# Patient Record
Sex: Female | Born: 1950
Health system: Southern US, Community
[De-identification: ages and names within clinical notes are randomized; demographics above are authoritative.]

## PROBLEM LIST (undated history)

## (undated) DIAGNOSIS — I10 Essential (primary) hypertension: Secondary | ICD-10-CM

## (undated) DIAGNOSIS — F419 Anxiety disorder, unspecified: Secondary | ICD-10-CM

## (undated) DIAGNOSIS — C189 Malignant neoplasm of colon, unspecified: Secondary | ICD-10-CM

## (undated) DIAGNOSIS — T753XXA Motion sickness, initial encounter: Secondary | ICD-10-CM

## (undated) DIAGNOSIS — D649 Anemia, unspecified: Secondary | ICD-10-CM

## (undated) HISTORY — PX: CHOLECYSTECTOMY: SHX55

## (undated) HISTORY — DX: Anemia, unspecified: D64.9

---

## 2012-11-05 DIAGNOSIS — C189 Malignant neoplasm of colon, unspecified: Secondary | ICD-10-CM

## 2012-11-05 HISTORY — DX: Malignant neoplasm of colon, unspecified: C18.9

## 2014-11-05 HISTORY — PX: COLON SURGERY: SHX602

## 2017-09-07 ENCOUNTER — Encounter: Payer: Self-pay | Admitting: Emergency Medicine

## 2017-09-07 ENCOUNTER — Ambulatory Visit
Admission: EM | Admit: 2017-09-07 | Discharge: 2017-09-07 | Disposition: A | Discharge status: Home / Self Care | Payer: Self-pay | Attending: Emergency Medicine | Admitting: Emergency Medicine

## 2017-09-07 DIAGNOSIS — N390 Urinary tract infection, site not specified: Secondary | ICD-10-CM

## 2017-09-07 HISTORY — DX: Malignant neoplasm of colon, unspecified: C18.9

## 2017-09-07 HISTORY — DX: Essential (primary) hypertension: I10

## 2017-09-07 LAB — URINALYSIS, COMPLETE (UACMP) WITH MICROSCOPIC
BILIRUBIN URINE: NEGATIVE
GLUCOSE, UA: NEGATIVE mg/dL
Ketones, ur: NEGATIVE mg/dL
NITRITE: NEGATIVE
Protein, ur: NEGATIVE mg/dL
pH: 6 (ref 5.0–8.0)

## 2017-09-07 MED ORDER — PHENAZOPYRIDINE HCL 200 MG PO TABS: 200.0000 mg | ORAL_TABLET | Freq: Three times a day (TID) | ORAL | 0 refills | Status: DC

## 2017-09-07 MED ORDER — NITROFURANTOIN MONOHYD MACRO 100 MG PO CAPS: 100.0000 mg | ORAL_CAPSULE | Freq: Two times a day (BID) | ORAL | 0 refills | Status: DC

## 2017-09-07 NOTE — ED Triage Notes (Signed)
Patient c/o burning when urinating for the past 5 days.

## 2017-09-07 NOTE — ED Provider Notes (Signed)
[facility]    CSN: [id number] Arrival date & time: 09/07/17  1459     History   Chief Complaint Chief Complaint  Patient presents with  . Dysuria    HPI Joyce Pace is a 66 y.o. female.   HPI  66 year old female visiting from [facility] presents with burning urgency frequency for the last 5 days. He denies any fever or chills has had no back pain. Said no vaginal discharge or discomfort. Had a UTI about 1 year ago. Is not get them often.         Past Medical History:  Diagnosis Date  . Colon cancer ([place])   . Hypertension     There are no active problems to display for this patient.   Past Surgical History:  Procedure Laterality Date  . ABDOMINAL SURGERY      OB History    No data available       Home Medications    Prior to Admission medications   Medication Sig Start Date End Date Taking? Authorizing Provider  lisinopril (PRINIVIL,ZESTRIL) 10 MG tablet Take 10 mg by mouth daily.   Yes [provider]  simvastatin (ZOCOR) 20 MG tablet Take 20 mg by mouth daily.   Yes [provider]  nitrofurantoin, macrocrystal-monohydrate, (MACROBID) 100 MG capsule Take 1 capsule (100 mg total) by mouth 2 (two) times daily. 09/07/17   Lorin Picket, PA-C  phenazopyridine (PYRIDIUM) 200 MG tablet Take 1 tablet (200 mg total) by mouth 3 (three) times daily. 09/07/17   Lorin Picket, PA-C    Family History Family History  Problem Relation Age of Onset  . Hyperlipidemia Mother   . Diabetes Father     Social History Social History  Substance Use Topics  . Smoking status: Never Smoker  . Smokeless tobacco: Never Used  . Alcohol use No     Allergies   Patient has no known allergies.   Review of Systems Review of Systems  Constitutional: Positive for activity change. Negative for chills, fatigue and fever.  Genitourinary: Positive for dysuria, frequency and urgency. Negative for vaginal discharge and  vaginal pain.  All other systems reviewed and are negative.    Physical Exam Triage Vital Signs ED Triage Vitals  Enc Vitals Group     BP --      Pulse --      Resp --      Temp --      Temp src --      SpO2 --      Weight 09/07/17 1537 150 lb (68 kg)     Height 09/07/17 1537 5' (1.524 m)     Head Circumference --      Peak Flow --      Pain Score 09/07/17 1538 3     Pain Loc --      Pain Edu? --      Excl. in [place]? --    No data found.   Updated Vital Signs BP 130/78 (BP Location: Left Arm)   Pulse 77   Temp 98.2 F (36.8 C) (Oral)   Resp 16   Ht 5' (1.524 m)   Wt 150 lb (68 kg)   SpO2 97%   BMI 29.29 kg/m   Visual Acuity Right Eye Distance:   Left Eye Distance:   Bilateral Distance:    Right Eye Near:   Left Eye Near:    Bilateral Near:     Physical Exam  Constitutional:  She is oriented to person, place, and time. She appears well-developed and well-nourished. No distress.  HENT:  Head: Normocephalic.  Eyes: Pupils are equal, round, and reactive to light.  Neck: Normal range of motion. Neck supple.  Pulmonary/Chest: Effort normal and breath sounds normal.  Abdominal: Soft. Bowel sounds are normal.  Musculoskeletal: Normal range of motion.  Neurological: She is alert and oriented to person, place, and time.  Skin: Skin is warm and dry. She is not diaphoretic.  Psychiatric: She has a normal mood and affect. Her behavior is normal. Judgment and thought content normal.  Nursing note and vitals reviewed.    UC Treatments / Results  Labs (all labs ordered are listed, but only abnormal results are displayed) Labs Reviewed  URINALYSIS, COMPLETE (UACMP) WITH MICROSCOPIC - Abnormal; Notable for the following:       Result Value   APPearance HAZY (*)    Specific Gravity, Urine <1.005 (*)    Hgb urine dipstick SMALL (*)    Leukocytes, UA TRACE (*)    Squamous Epithelial / LPF 0-5 (*)    Bacteria, UA FEW (*)    All other components within normal limits    URINE CULTURE    EKG  EKG Interpretation None       Radiology No results found.  Procedures Procedures (including critical care time)  Medications Ordered in UC Medications - No data to display   Initial Impression / Assessment and Plan / UC Course  I have reviewed the triage vital signs and the nursing notes.  Pertinent labs & imaging results that were available during my care of the patient were reviewed by me and considered in my medical decision making (see chart for details).     Plan: 1. Test/x-ray results and diagnosis reviewed with patient 2. rx as per orders; risks, benefits, potential side effects reviewed with patient 3. Recommend supportive treatment with rest fluids. We will culture her urine which will be available in 48 hours. I prescribed Pyridium and advised her that this will turn her urine orange. If she is not improving she may return to our clinic. 4. F/u prn if symptoms worsen or don't improve   Final Clinical Impressions(s) / UC Diagnoses   Final diagnoses:  Lower urinary tract infectious disease    New Prescriptions Discharge Medication List as of 09/07/2017  4:45 PM    START taking these medications   Details  nitrofurantoin, macrocrystal-monohydrate, (MACROBID) 100 MG capsule Take 1 capsule (100 mg total) by mouth 2 (two) times daily., Starting Sat 09/07/2017, Normal    phenazopyridine (PYRIDIUM) 200 MG tablet Take 1 tablet (200 mg total) by mouth 3 (three) times daily., Starting Sat 09/07/2017, Normal         Controlled Substance Prescriptions [place] Controlled Substance Registry consulted? Not Applicable   Lorin Picket, PA-C 09/07/17 1656

## 2017-09-09 LAB — URINE CULTURE: Culture: <10000 — AB

## 2017-09-13 ENCOUNTER — Ambulatory Visit: Payer: Self-pay | Admitting: Family Medicine

## 2017-12-13 ENCOUNTER — Telehealth: Payer: Self-pay | Admitting: Emergency Medicine

## 2017-12-13 NOTE — Telephone Encounter (Signed)
Patient called after receiving a letter in the mail.  Patient was informed of her urine culture result.  Patient states that her symptoms have resolve and feeling better.

## 2018-02-19 ENCOUNTER — Encounter: Payer: Self-pay | Admitting: Nurse Practitioner

## 2018-02-19 ENCOUNTER — Telehealth: Payer: Self-pay

## 2018-02-19 ENCOUNTER — Other Ambulatory Visit: Payer: Self-pay

## 2018-02-19 ENCOUNTER — Ambulatory Visit (INDEPENDENT_AMBULATORY_CARE_PROVIDER_SITE_OTHER): Payer: Medicare Other | Admitting: Nurse Practitioner

## 2018-02-19 VITALS — BP 150/60 | HR 70 | Temp 97.8°F | Ht 60.0 in | Wt 151.6 lb

## 2018-02-19 DIAGNOSIS — E785 Hyperlipidemia, unspecified: Secondary | ICD-10-CM

## 2018-02-19 DIAGNOSIS — K219 Gastro-esophageal reflux disease without esophagitis: Secondary | ICD-10-CM

## 2018-02-19 DIAGNOSIS — Z7689 Persons encountering health services in other specified circumstances: Secondary | ICD-10-CM | POA: Diagnosis not present

## 2018-02-19 DIAGNOSIS — F329 Major depressive disorder, single episode, unspecified: Secondary | ICD-10-CM

## 2018-02-19 DIAGNOSIS — I10 Essential (primary) hypertension: Secondary | ICD-10-CM | POA: Diagnosis not present

## 2018-02-19 DIAGNOSIS — F32A Depression, unspecified: Secondary | ICD-10-CM | POA: Insufficient documentation

## 2018-02-19 DIAGNOSIS — R142 Eructation: Secondary | ICD-10-CM

## 2018-02-19 DIAGNOSIS — K5909 Other constipation: Secondary | ICD-10-CM | POA: Diagnosis not present

## 2018-02-19 DIAGNOSIS — R1013 Epigastric pain: Secondary | ICD-10-CM | POA: Diagnosis not present

## 2018-02-19 DIAGNOSIS — F419 Anxiety disorder, unspecified: Secondary | ICD-10-CM | POA: Insufficient documentation

## 2018-02-19 DIAGNOSIS — E782 Mixed hyperlipidemia: Secondary | ICD-10-CM | POA: Insufficient documentation

## 2018-02-19 DIAGNOSIS — Z85038 Personal history of other malignant neoplasm of large intestine: Secondary | ICD-10-CM | POA: Insufficient documentation

## 2018-02-19 MED ORDER — SIMVASTATIN 20 MG PO TABS: 20.0000 mg | ORAL_TABLET | Freq: Every day | ORAL | 1 refills | Status: DC

## 2018-02-19 MED ORDER — LISINOPRIL 20 MG PO TABS: 20.0000 mg | ORAL_TABLET | Freq: Every day | ORAL | 2 refills | Status: DC

## 2018-02-19 MED ORDER — DOCUSATE SODIUM 100 MG PO CAPS: 100.0000 mg | ORAL_CAPSULE | Freq: Every day | ORAL | 1 refills | Status: DC | PRN

## 2018-02-19 MED ORDER — POLYETHYLENE GLYCOL 3350 17 GM/SCOOP PO POWD: 17.0000 g | Freq: Every day | ORAL | 1 refills | Status: DC

## 2018-02-19 MED ORDER — PAROXETINE HCL 10 MG PO TABS: 10.0000 mg | ORAL_TABLET | ORAL | 2 refills | Status: DC

## 2018-02-19 NOTE — Telephone Encounter (Signed)
The pt was calling requesting that we send something over for her acid reflux. She said she was taking Protonix 40 MG at one time, but symptoms improved. She is willing to go on Protonix 20 MG if that's okay with you, but would do whatever you recommend. Please advise

## 2018-02-19 NOTE — Progress Notes (Signed)
Subjective:    Patient ID: Joyce Pace, female    DOB: 05-07-1951, 67 y.o.   MRN: [id number]  Joyce Pace is a 67 y.o. female presenting on 02/19/2018 for Establish Care (history colon cancer, need order colonoscopy, bloodwork.); Constipation (pt states she can go with bm for 2-3 days and then she have to take a stimulant laxative); and Abdominal Pain   HPI Establish Care New Provider Pt last seen by PCP approximately 8 months ago.  Obtain records.   Abdominal Pain Pt reports intermittent, but daily abdominal pain that pt presumes may be Gas pain.  She reports her "eating habits are bad."  She admits she eats small snacks and meals on the go and doesn't sit to eat for meals. - Is working to incorporate fruits and vegetables into smoothies. - Abdominal pain is only ocasional.  Has to stretch out, if leaning forward or standing, is worse. Is epigastric pain.  Han press upper abdomen and has belching then relief.  Significant belching is noted and is bothersome to pt. - Took a medication for acid reflux "Purple pill" about 10-15 years ago.  - Pt has history of colon cancer stage "3 and a half" spread to ovary, but ovary was removed.  Chemo x 6 months  Followup with CT scan and blood work.  Was due in October 2018 and missed that appointment 2/2 relocation to [place] from [place].  Colonoscopy also due per pt report for surveillance.  - Constipation: takes milk of magnesia, also "fiber pills" (colon cleanse supplement) to achieve BM Has BM regularly when taking the colon cleanse supplement or milk of magnesia. - Pt has taken metamucil and miralax x 2 days in past and notes this is not effective. - No medications have resolved epigastric pain or belching.  Past Medical History:  Diagnosis Date  . Colon cancer ([place])   . Hypertension    Past Surgical History:  Procedure Laterality Date  . CHOLECYSTECTOMY    . COLON SURGERY  2016   colon resection   Social History   Socioeconomic  History  . Marital status: Married    Spouse name: Not on file  . Number of children: Not on file  . Years of education: Not on file  . Highest education level: High school graduate  Occupational History  . Not on file  Social Needs  . Financial resource strain: Not very hard  . Food insecurity:    Worry: Never true    Inability: Never true  . Transportation needs:    Medical: No    Non-medical: No  Tobacco Use  . Smoking status: Never Smoker  . Smokeless tobacco: Never Used  Substance and Sexual Activity  . Alcohol use: No  . Drug use: No  . Sexual activity: Not Currently  Lifestyle  . Physical activity:    Days per week: 2 days    Minutes per session: 30 min  . Stress: To some extent  Relationships  . Social connections:    Talks on phone: Not on file    Gets together: Not on file    Attends religious service: More than 4 times per year    Active member of club or organization: Yes    Attends meetings of clubs or organizations: More than 4 times per year    Relationship status: Married  . Intimate partner violence:    Fear of current or ex partner: No    Emotionally abused: No    Physically abused: No  Forced sexual activity: No  Other Topics Concern  . Not on file  Social History Narrative  . Not on file   Family History  Problem Relation Age of Onset  . Hyperlipidemia Mother   . Hypertension Mother   . Diabetes Father   . Hypertension Sister   . Hyperlipidemia Sister   . Hyperlipidemia Brother   . Hypertension Brother   . Ovarian cancer Daughter   . Healthy Son   . Heart disease Maternal Grandmother   . Heart disease Maternal Grandfather   . Heart disease Paternal Grandmother   . Heart disease Paternal Grandfather   . Hyperlipidemia Sister   . Healthy Daughter    No current outpatient medications on file prior to visit.   No current facility-administered medications on file prior to visit.     Review of Systems  Constitutional: Negative.   Negative for activity change, appetite change and unexpected weight change.  HENT: Negative.  Negative for congestion.   Eyes: Negative.   Respiratory: Negative.  Negative for cough and shortness of breath.   Cardiovascular: Negative.  Negative for chest pain and leg swelling.  Gastrointestinal: Positive for abdominal pain and constipation.       Belching  Endocrine: Negative.  Negative for cold intolerance and heat intolerance.  Genitourinary: Negative.   Musculoskeletal: Negative.  Negative for arthralgias and myalgias.  Skin: Negative.  Negative for rash.  Allergic/Immunologic: Negative.   Neurological: Negative.   Hematological: Negative.   Psychiatric/Behavioral: Positive for dysphoric mood (tearfulness - daughter is diagnosed with ovarian cancer and is undergoing chemo). The patient is nervous/anxious.    Per HPI unless specifically indicated above     Objective:    BP (!) 150/60 (BP Location: Left Arm, Patient Position: Sitting, Cuff Size: Normal)   Pulse 70   Temp 97.8 F (36.6 C) (Oral)   Ht 5' (1.524 m)   Wt 151 lb 9.6 oz (68.8 kg)   BMI 29.61 kg/m   Wt Readings from Last 3 Encounters:  02/19/18 151 lb 9.6 oz (68.8 kg)  09/07/17 150 lb (68 kg)    Physical Exam  Constitutional: She is oriented to person, place, and time. She appears well-developed and well-nourished. No distress.  HENT:  Head: Normocephalic and atraumatic.  Neck: Normal range of motion. Neck supple. Carotid bruit is not present. No thyromegaly present.  Cardiovascular: Normal rate, regular rhythm, S1 normal, S2 normal, normal heart sounds and intact distal pulses.  Pulmonary/Chest: Effort normal and breath sounds normal. No respiratory distress.  Abdominal: Soft. Normal appearance and bowel sounds are normal. She exhibits no shifting dullness and no distension. There is tenderness in the right upper quadrant and epigastric area. There is no rigidity, no rebound, no guarding, no CVA tenderness, no  tenderness at McBurney's point and negative Murphy's sign. No hernia.  Musculoskeletal: She exhibits no edema (pedal).  Neurological: She is alert and oriented to person, place, and time.  Skin: Skin is warm and dry.  Psychiatric: Her behavior is normal. Judgment and thought content normal. Her affect is labile (tearful at times, mildly anxious). Cognition and memory are normal.  Vitals reviewed.  Results for orders placed or performed during the hospital encounter of 09/07/17  Urine culture  Result Value Ref Range   Specimen Description URINE, CLEAN CATCH    Special Requests NONE    Culture (A)     <10,000 COLONIES/mL INSIGNIFICANT GROWTH Performed at [facility] [address], [place], [place] [address]  Report Status 09/09/2017 FINAL   Urinalysis, Complete w Microscopic  Result Value Ref Range   Color, Urine YELLOW YELLOW   APPearance HAZY (A) CLEAR   Specific Gravity, Urine <1.005 (L) 1.005 - 1.030   pH 6.0 5.0 - 8.0   Glucose, UA NEGATIVE NEGATIVE mg/dL   Hgb urine dipstick SMALL (A) NEGATIVE   Bilirubin Urine NEGATIVE NEGATIVE   Ketones, ur NEGATIVE NEGATIVE mg/dL   Protein, ur NEGATIVE NEGATIVE mg/dL   Nitrite NEGATIVE NEGATIVE   Leukocytes, UA TRACE (A) NEGATIVE   Squamous Epithelial / LPF 0-5 (A) NONE SEEN   WBC, UA 21-50 0 - 5 WBC/hpf   RBC / HPF 6-30 0 - 5 RBC/hpf   Bacteria, UA FEW (A) NONE SEEN      Assessment & Plan:   Problem List Items Addressed This Visit      Cardiovascular and Mediastinum   Essential hypertension   Relevant Medications   simvastatin (ZOCOR) 20 MG tablet   lisinopril (PRINIVIL,ZESTRIL) 20 MG tablet     Digestive   History of colon cancer, stage III   Relevant Orders   Ambulatory referral to Gastroenterology     Other   Hyperlipidemia   Relevant Medications   simvastatin (ZOCOR) 20 MG tablet   lisinopril (PRINIVIL,ZESTRIL) 20 MG tablet   Reactive depression   Relevant Medications   PARoxetine (PAXIL) 10 MG tablet  (Start on 02/20/2018)    Other Visit Diagnoses    Epigastric pain    -  Primary   Encounter to establish care       Belching       Other constipation       Relevant Medications   docusate sodium (COLACE) 100 MG capsule   polyethylene glycol powder (GLYCOLAX/MIRALAX) powder      Meds ordered this encounter  Medications  . simvastatin (ZOCOR) 20 MG tablet    Sig: Take 1 tablet (20 mg total) by mouth daily.    Dispense:  30 tablet    Refill:  1    Order Specific Question:   Supervising Provider    Answer:   Olin Hauser [[id number]]  . PARoxetine (PAXIL) 10 MG tablet    Sig: Take 1 tablet (10 mg total) by mouth 2 (two) times a week.    Dispense:  30 tablet    Refill:  2    Order Specific Question:   Supervising Provider    Answer:   Olin Hauser [[id number]]  . lisinopril (PRINIVIL,ZESTRIL) 20 MG tablet    Sig: Take 1 tablet (20 mg total) by mouth daily.    Dispense:  30 tablet    Refill:  2    Order Specific Question:   Supervising Provider    Answer:   Olin Hauser [[id number]]  . docusate sodium (COLACE) 100 MG capsule    Sig: Take 1 capsule (100 mg total) by mouth daily as needed for mild constipation (If no BM in 2 days).    Dispense:  30 capsule    Refill:  1    Order Specific Question:   Supervising Provider    Answer:   Olin Hauser [[id number]]  . polyethylene glycol powder (GLYCOLAX/MIRALAX) powder    Sig: Take 17 g by mouth daily.    Dispense:  3350 g    Refill:  1    Order Specific Question:   Supervising Provider    Answer:   Olin Hauser [[id number]]   #1 Establish Care: Previous PCP was  in [place].  Records will be requested.  Past medical, family, and surgical history reviewed w/ pt. - Last physical exam was in April 2018.  New to medicare. Schedule Welcome to Medicare visit for screenings.   #2 Abdominal pain, constipation, bloating: Pt with subacute to chronic abdominal pain with belching not always associated with eating.  Pain  resolves with belching.  Likely secondary to GERD as pt has had reflux in past treated with Prilosec.  No recent acid reducers have been tried.  Constipation is also regular with inability to have BM in 2-3 days regularly.  Pt is taking milk of magnesia with effect, but is unable to have BM without this medication.  Can also worsen abdominal pain with strong laxative effect.  Plan: 1. Treat GERD with omeprazole 20 mg once daily. 2. START miralax 1/2-1 dose daily. 3. START colace 100 mg tablet once daily prn no BM in 2 days. 4. Avoid use of milk of mag and colon cleanse supplement. 5. Followup as needed and with GI  #3 History of colon cancer: Pt without recent followup for schedule of surveillance.  Due in October 2018, so is 6 months overdue.  - Referral to GI for colonoscopy.  Will defer bloodwork to GI for further evaluation.  #4 Depression (reactive) in response to daughter's cancer diagnosis and treatment.  Pt has taken paxil in past 20 mg daily, but is currently only taking 10 mg 2x per week which is not an effective dose.  Plan: 1. Take Paxil 10 mg tablet once daily.  Consider dose increase if not effective at next appointment.   Follow up plan: Return in about 1 month (around 03/19/2018) for annual physical.  Cassell Smiles, DNP, AGPCNP-BC Adult Gerontology Primary Care Nurse Practitioner [facility] 02/19/2018, 12:47 PM

## 2018-02-19 NOTE — Patient Instructions (Addendum)
Joyce Pace,   Thank you for coming in to clinic today.  1. For your abdominal pain: This is likely acid reflux.   START taking omeprazole 20 mg once daily.  Take daily for at least 2 weeks.  You may continue and we can reassess in 3 months.  2. For your constipation: -  START daily miralax 1/2 - 1 dose daily. - START Colace 100 mg capsule once daily as needed if no BM in 2 days.  3. Continue simvastatin.  4. INCREASE lisinopril to 20 mg once daily  5. INCREASE paxil to 10 mg daily.  For your referral to GI, their office will call you.   Please schedule a follow-up appointment with Cassell Smiles, AGNP. Return in about 1 month (around 03/19/2018) for annual physical.  If you have any other questions or concerns, please feel free to call the clinic or send a message through [facility]. You may also schedule an earlier appointment if necessary.  You will receive a survey after today's visit either digitally by e-mail or paper by [employer]. Your experiences and feedback matter to [place].  Please respond so we know how we are doing as we provide care for you.   Cassell Smiles, DNP, AGNP-BC Adult Gerontology Nurse Practitioner [place]

## 2018-02-20 ENCOUNTER — Other Ambulatory Visit: Payer: Self-pay | Admitting: Nurse Practitioner

## 2018-02-20 DIAGNOSIS — F329 Major depressive disorder, single episode, unspecified: Secondary | ICD-10-CM

## 2018-02-20 MED ORDER — PAROXETINE HCL 10 MG PO TABS: 10.0000 mg | ORAL_TABLET | Freq: Every day | ORAL | 2 refills | Status: DC

## 2018-02-20 MED ORDER — OMEPRAZOLE 20 MG PO CPDR: 20.0000 mg | DELAYED_RELEASE_CAPSULE | Freq: Every day | ORAL | 3 refills | Status: DC

## 2018-02-20 NOTE — Telephone Encounter (Signed)
We had discussed starting omeprazole 20 mg once daily during her visit.  Order wasn't sent.  Apologies for inconveniences.

## 2018-03-05 ENCOUNTER — Other Ambulatory Visit: Payer: Self-pay | Admitting: Nurse Practitioner

## 2018-03-05 ENCOUNTER — Ambulatory Visit: Payer: Self-pay | Admitting: Gastroenterology

## 2018-03-05 ENCOUNTER — Telehealth: Payer: Self-pay | Admitting: Nurse Practitioner

## 2018-03-05 DIAGNOSIS — I10 Essential (primary) hypertension: Secondary | ICD-10-CM

## 2018-03-05 NOTE — Telephone Encounter (Signed)
The pt was notified that all her prescription was sent over to her pharmacy. She verbalize understanding, no questions or concerns.

## 2018-03-05 NOTE — Telephone Encounter (Signed)
Please call pt about anxiety medication [phone]

## 2018-03-05 NOTE — Progress Notes (Deleted)
She was recently seen by Mikey College, NP and was complaining of constipation, abdominal pain. She has had a history of colon cancer in the past "stage 3", did mention that her colonoscopy was due in 08/2017 .    Constipation: Onset *** Frequency of bowel movements *** Consistency *** Shape *** , any new change Pain *** Bloating/abdominal distension *** Bleeding *** Last colonoscopy *** Weight loss *** Family history of colon cancer *** Diet *** Narcotic/anticholinergic medications *** Laxative use *** Thyroid abnormalities ***  For colonoscopy for surveillance after colon cancer. H/o new onset constipation.   Plan  1. High fiber diet patient information provided 2. Colonoscopy    I have discussed alternative options, risks & benefits,  which include, but are not limited to, bleeding, infection, perforation,respiratory complication & drug reaction.  The patient agrees with this plan & written consent will be obtained.

## 2018-04-07 ENCOUNTER — Other Ambulatory Visit: Payer: Self-pay

## 2018-04-07 ENCOUNTER — Ambulatory Visit (INDEPENDENT_AMBULATORY_CARE_PROVIDER_SITE_OTHER): Payer: Medicare Other | Admitting: Nurse Practitioner

## 2018-04-07 ENCOUNTER — Encounter: Payer: Self-pay | Admitting: Nurse Practitioner

## 2018-04-07 VITALS — BP 131/55 | HR 69 | Ht 60.0 in | Wt 151.4 lb

## 2018-04-07 DIAGNOSIS — Z85038 Personal history of other malignant neoplasm of large intestine: Secondary | ICD-10-CM

## 2018-04-07 DIAGNOSIS — E782 Mixed hyperlipidemia: Secondary | ICD-10-CM

## 2018-04-07 DIAGNOSIS — K219 Gastro-esophageal reflux disease without esophagitis: Secondary | ICD-10-CM

## 2018-04-07 DIAGNOSIS — I10 Essential (primary) hypertension: Secondary | ICD-10-CM

## 2018-04-07 DIAGNOSIS — Z1329 Encounter for screening for other suspected endocrine disorder: Secondary | ICD-10-CM

## 2018-04-07 DIAGNOSIS — F329 Major depressive disorder, single episode, unspecified: Secondary | ICD-10-CM

## 2018-04-07 DIAGNOSIS — E785 Hyperlipidemia, unspecified: Secondary | ICD-10-CM

## 2018-04-07 MED ORDER — SIMVASTATIN 20 MG PO TABS: 20.0000 mg | ORAL_TABLET | Freq: Every day | ORAL | 1 refills | Status: DC

## 2018-04-07 MED ORDER — PAROXETINE HCL 10 MG PO TABS: 10.0000 mg | ORAL_TABLET | Freq: Every day | ORAL | 1 refills | Status: DC

## 2018-04-07 MED ORDER — OMEPRAZOLE 20 MG PO CPDR: 20.0000 mg | DELAYED_RELEASE_CAPSULE | Freq: Every day | ORAL | 1 refills | Status: DC

## 2018-04-07 NOTE — Progress Notes (Signed)
Subjective:    Patient ID: Joyce Pace, female    DOB: [birth year], 67 y.o.   MRN: [id number]  Joyce Pace is a 67 y.o. female presenting on 04/07/2018 for Hypertension; Gastroesophageal Reflux; and Insomnia   HPI Hypertension - She is not checking BP at home or outside of clinic.    - Current medications: lisinopril 20 mg daily, tolerating well without side effects - She is not currently symptomatic. - Pt denies headache, lightheadedness, dizziness, changes in vision, chest tightness/pressure, palpitations, leg swelling, sudden loss of speech or loss of consciousness.  Lightheadeness is improved and has not been experienced in several weeks. - She  reports an exercise routine that includes housework, woodworking, 5-6 days per week. - Her diet is moderate in salt, moderate in fat, and moderate in carbohydrates.   Hyperlipidemia Pt has started and continued taking simvastatin 20 mg once daily.  Is taking without side effects.  Denies myalgias and cognitive deficits.  Patient requests lab recheck today.  GERD Bloating and gas is much improved and completely resolved per pt.  Omeprazole is helping symptoms significantly.  Pt used miralax and colace for short term.  Now is only using metamucil generic as needed for constipation.  No heartburn or stomach pain today.  Denies all globus sensation, GI bleed symptoms, constipation, diarrhea, nausea, vomiting.  History of colon cancer Last CEA = 3.2 February 22, 2017 - history of colon ca with resection.  Status post chemo [place].  Final visit summary available in scanned documents.  Patient is due for GI visit April 16, 2018.  Requests to have tumor marker collected today.  Insomnia/tearfulness/depression Patient notes her insomnia continues, but is back to baseline for her.  She is less tearful, but is sensitive and is tearful in appropriate situations.  Depression is resolving per patient.  Moods are much more stable.  Patient is  taking paroxetine 10 mg once daily and tolerating well without side effects.  Patient also notes life stressors are beginning to improve.  Depression screen PHQ 2/9 02/19/2018  Decreased Interest 0  Down, Depressed, Hopeless 1  PHQ - 2 Score 1    Social History   Tobacco Use  . Smoking status: Never Smoker  . Smokeless tobacco: Never Used  Substance Use Topics  . Alcohol use: No  . Drug use: No    Review of Systems Per HPI unless specifically indicated above     Objective:    BP (!) 131/55 (BP Location: Right Arm, Patient Position: Sitting, Cuff Size: Normal)   Pulse 69   Ht 5' (1.524 m)   Wt 151 lb 6.4 oz (68.7 kg)   BMI 29.57 kg/m   Wt Readings from Last 3 Encounters:  04/07/18 151 lb 6.4 oz (68.7 kg)  02/19/18 151 lb 9.6 oz (68.8 kg)  09/07/17 150 lb (68 kg)    Physical Exam  Constitutional: She is oriented to person, place, and time. She appears well-developed and well-nourished. No distress.  HENT:  Head: Normocephalic and atraumatic.  Neck: No thyromegaly present.  Cardiovascular: Normal rate, regular rhythm, S1 normal, S2 normal, normal heart sounds and intact distal pulses.  Pulmonary/Chest: Effort normal and breath sounds normal. No respiratory distress.  Abdominal: Soft. Bowel sounds are normal. She exhibits no distension. There is no hepatosplenomegaly. There is no tenderness. No hernia.  Neurological: She is alert and oriented to person, place, and time.  Skin: Skin is warm and dry. Capillary refill takes less than 2 seconds.  Psychiatric: She has  a normal mood and affect. Her behavior is normal. Judgment and thought content normal.  Vitals reviewed.  Results for orders placed or performed during the hospital encounter of 09/07/17  Urine culture  Result Value Ref Range   Specimen Description URINE, CLEAN CATCH    Special Requests NONE    Culture (A)     <10,000 COLONIES/mL INSIGNIFICANT GROWTH Performed at [facility] [address],  [place], [place] [address]    Report Status 09/09/2017 FINAL   Urinalysis, Complete w Microscopic  Result Value Ref Range   Color, Urine YELLOW YELLOW   APPearance HAZY (A) CLEAR   Specific Gravity, Urine <1.005 (L) 1.005 - 1.030   pH 6.0 5.0 - 8.0   Glucose, UA NEGATIVE NEGATIVE mg/dL   Hgb urine dipstick SMALL (A) NEGATIVE   Bilirubin Urine NEGATIVE NEGATIVE   Ketones, ur NEGATIVE NEGATIVE mg/dL   Protein, ur NEGATIVE NEGATIVE mg/dL   Nitrite NEGATIVE NEGATIVE   Leukocytes, UA TRACE (A) NEGATIVE   Squamous Epithelial / LPF 0-5 (A) NONE SEEN   WBC, UA 21-50 0 - 5 WBC/hpf   RBC / HPF 6-30 0 - 5 RBC/hpf   Bacteria, UA FEW (A) NONE SEEN      Assessment & Plan:   Problem List Items Addressed This Visit      Cardiovascular and Mediastinum   Essential hypertension Controlled today on exam.  Medications tolerated without side effects.  Continue at current doses.  Check labs today. Followup 6 months.    Relevant Medications   simvastatin (ZOCOR) 40 MG tablet   Other Relevant Orders   COMPLETE METABOLIC PANEL WITH GFR (Completed)     Other   Hyperlipidemia Stable today on exam and last lipid check.  Medications tolerated without side effects.  Continue at current doses.  Refills provided.  Check labs today. Followup 6 months.    Relevant Medications   simvastatin (ZOCOR) 40 MG tablet   Other Relevant Orders   Lipid panel (Completed)   TSH (Completed)   Reactive depression Improved today on exam.  Medications tolerated without side effects.  Continue at current doses.  Refills provided.  Encouraged ongoing self-management of moods/stress reduction. Followup 6 months.    Relevant Medications   PARoxetine (PAXIL) 10 MG tablet    Other Visit Diagnoses    Gastroesophageal reflux disease, esophagitis presence not specified    -  Primary Worsening with regular abdominal pain and heartburn.  Possible overuse of OTC laxatives.  Patient has not seen Dr. Bonna Gains yet, but is scheduled in  next 2 weeks.  START omeprazole 20 mg once daily.  Avoid food triggers.  Use OTC laxatives only as needed for constipation > 3 days without BM.  Labs today.  Followup as needed and in 6 months.   Relevant Medications   omeprazole (PRILOSEC) 20 MG capsule   Other Relevant Orders   CBC with Differential/Platelet (Completed)   Screening for thyroid disorder     Screening for TSH abnormality with Constipation/diarrhea.  Labs today.  Followup as needed.   Relevant Orders   TSH (Completed)   History of colon cancer, stage II     Continue with labs for monitoring tumor marker.  Continue follow-up with Dr. Bonna Gains for repeat screening colonoscopy.  Consider future referral to oncology for followup.   Relevant Orders   CEA      Meds ordered this encounter  Medications  . omeprazole (PRILOSEC) 20 MG capsule    Sig: Take 1 capsule (  20 mg total) by mouth daily.    Dispense:  90 capsule    Refill:  1    Order Specific Question:   Supervising Provider    Answer:   Olin Hauser [[id number]]  .  PARoxetine (PAXIL) 10 MG tablet    Sig: Take 1 tablet (10 mg total) by mouth daily.    Dispense:  90 tablet    Refill:  1    Order Specific Question:   Supervising Provider    Answer:   Olin Hauser [[id number]]  . DISCONTD: simvastatin (ZOCOR) 20 MG tablet    Sig: Take 1 tablet (20 mg total) by mouth daily.    Dispense:  90 tablet    Refill:  1    Order Specific Question:   Supervising Provider    Answer:   Olin Hauser [[id number]]  . simvastatin (ZOCOR) 40 MG tablet    Sig: Take 1 tablet (40 mg total) by mouth at bedtime.    Dispense:  90 tablet    Refill:  3    NOTE INCREASED DOSE    Order Specific Question:   Supervising Provider    Answer:   Olin Hauser [[id number]]    Follow up plan: Return in about 2 months (around 06/07/2018) for Welcome to Medicare wellness and 6 months for  hypertension, GERD, Depression.  Cassell Smiles, DNP, AGPCNP-BC Adult Gerontology  Primary Care Nurse Practitioner [facility] 04/07/2018, 11:05 AM

## 2018-04-07 NOTE — Patient Instructions (Addendum)
Joyce Pace,   Thank you for coming in to clinic today.  1. Continue medications today for blood pressure and heartburn without changes. - Laxative(Miralax or Metamucil) and Stool Softener (Colace) are now as needed for bowel movement at least once every 2-3 days.  2. Continue paroxetine daily for moods.  3. You will be due for FASTING BLOOD WORK.  This means you should eat no food or drink after midnight.  Drink only water or coffee without cream/sugar on the morning of your lab visit. - Please go ahead and schedule a "Lab Only" visit in the morning at the clinic for lab draw in the next 7 days between 8am - 11:30 am. - Your results will be available about 2-3 days after blood draw.  If you have set up a MyChart account, you can can log in to MyChart online to view your results and a brief explanation. Also, we can discuss your results together at your next office visit if you would like.   Please schedule a follow-up appointment with Cassell Smiles, AGNP. Return in about 2 months (around 06/07/2018) for Welcome to Medicare wellness and 6 months for  hypertension, GERD, Depression.  If you have any other questions or concerns, please feel free to call the clinic or send a message through [facility]. You may also schedule an earlier appointment if necessary.  You will receive a survey after today's visit either digitally by e-mail or paper by [employer]. Your experiences and feedback matter to [place].  Please respond so we know how we are doing as we provide care for you.   Cassell Smiles, DNP, AGNP-BC Adult Gerontology Nurse Practitioner [facility]

## 2018-04-08 ENCOUNTER — Other Ambulatory Visit: Payer: Medicare Other

## 2018-04-09 LAB — TSH: TSH: 0.87 mIU/L (ref 0.40–4.50)

## 2018-04-09 LAB — COMPLETE METABOLIC PANEL WITH GFR
AG Ratio: 1.5 (calc) (ref 1.0–2.5)
ALT: 11 U/L (ref 6–29)
AST: 17 U/L (ref 10–35)
Albumin: 4.1 g/dL (ref 3.6–5.1)
Alkaline phosphatase (APISO): 101 U/L (ref 33–130)
BUN: 15 mg/dL (ref 7–25)
CO2: 29 mmol/L (ref 20–32)
Calcium: 9.3 mg/dL (ref 8.6–10.4)
Chloride: 106 mmol/L (ref 98–110)
Creat: 0.7 mg/dL (ref 0.50–0.99)
GFR, Est African American: 105 mL/min/{1.73_m2} (ref >=60)
GFR, Est Non African American: 91 mL/min/{1.73_m2} (ref >=60)
Globulin: 2.7 g/dL (calc) (ref 1.9–3.7)
Glucose, Bld: 100 mg/dL — ABNORMAL HIGH (ref 65–99)
Potassium: 4.5 mmol/L (ref 3.5–5.3)
Sodium: 140 mmol/L (ref 135–146)
Total Bilirubin: 1.3 mg/dL — ABNORMAL HIGH (ref 0.2–1.2)
Total Protein: 6.8 g/dL (ref 6.1–8.1)

## 2018-04-09 LAB — CBC WITH DIFFERENTIAL/PLATELET
Basophils Absolute: 40 cells/uL (ref 0–200)
Basophils Relative: 0.9 %
Eosinophils Absolute: 251 cells/uL (ref 15–500)
Eosinophils Relative: 5.7 %
HCT: 37.7 % (ref 35.0–45.0)
Hemoglobin: 12.2 g/dL (ref 11.7–15.5)
Lymphs Abs: 1558 cells/uL (ref 850–3900)
MCH: 24.7 pg — ABNORMAL LOW (ref 27.0–33.0)
MCHC: 32.4 g/dL (ref 32.0–36.0)
MCV: 76.3 fL — ABNORMAL LOW (ref 80.0–100.0)
MPV: 11.1 fL (ref 7.5–12.5)
Monocytes Relative: 7.4 %
Neutro Abs: 2226 cells/uL (ref 1500–7800)
Neutrophils Relative %: 50.6 %
Platelets: 209 10*3/uL (ref 140–400)
RBC: 4.94 10*6/uL (ref 3.80–5.10)
RDW: 13.5 % (ref 11.0–15.0)
Total Lymphocyte: 35.4 %
WBC mixed population: 326 cells/uL (ref 200–950)
WBC: 4.4 10*3/uL (ref 3.8–10.8)

## 2018-04-09 LAB — LIPID PANEL
Cholesterol: 189 mg/dL (ref <200)
HDL: 51 mg/dL (ref >50)
LDL Cholesterol (Calc): 117 mg/dL (calc) — ABNORMAL HIGH
Non-HDL Cholesterol (Calc): 138 mg/dL (calc) — ABNORMAL HIGH (ref <130)
Total CHOL/HDL Ratio: 3.7 (calc) (ref <5.0)
Triglycerides: 99 mg/dL (ref <150)

## 2018-04-09 LAB — CEA: CEA: 1.9 ng/mL

## 2018-04-09 MED ORDER — SIMVASTATIN 40 MG PO TABS: 40.0000 mg | ORAL_TABLET | Freq: Every day | ORAL | 3 refills | Status: DC

## 2018-04-16 ENCOUNTER — Ambulatory Visit: Payer: Self-pay | Admitting: Gastroenterology

## 2018-04-17 ENCOUNTER — Ambulatory Visit (INDEPENDENT_AMBULATORY_CARE_PROVIDER_SITE_OTHER): Payer: Medicare Other | Admitting: Gastroenterology

## 2018-04-17 ENCOUNTER — Encounter: Payer: Self-pay | Admitting: Gastroenterology

## 2018-04-17 VITALS — BP 149/81 | HR 70 | Ht 60.0 in | Wt 153.0 lb

## 2018-04-17 DIAGNOSIS — R718 Other abnormality of red blood cells: Secondary | ICD-10-CM | POA: Diagnosis not present

## 2018-04-17 DIAGNOSIS — Z85038 Personal history of other malignant neoplasm of large intestine: Secondary | ICD-10-CM

## 2018-04-17 DIAGNOSIS — Z9189 Other specified personal risk factors, not elsewhere classified: Secondary | ICD-10-CM

## 2018-04-17 NOTE — Progress Notes (Signed)
Joyce Pace [address]  [place]  [place], [place] [address]  Main: [phone]  Fax: [phone]   Gastroenterology Consultation  Referring Provider:     Mikey Pace, * Primary Care Physician:  Joyce College, NP Primary Gastroenterologist:  Dr. Vonda Pace Reason for Consultation:     History of colon cancer, evaluate for colonoscopy        HPI:    Chief Complaint  Patient presents with  . Establish Care    refered by Cassell Smiles, NP for hx colon cancer stage III    Joyce Pace is a 67 y.o. y/o female referred for consultation & management  by Dr. Merrilyn Puma, Jerrel Ivory, NP.  Patient recently moved from [place], and was diagnosed with colon cancer in 2014.  At that time patient said she started having hematochezia, which led to the colonoscopy that diagnosed sigmoid mass.  See scanned chart under media.  She had never had a colonoscopy prior to that.  No family members with colon cancer.  Diagnostic laparoscopy performed on January 2014, with laparoscopic sigmoid colectomy with en bloc left salpingo-oophorectomy, flexible sigmoidoscopy and mobilization of splenic flexure.  Operative report states that patient presented with bleeding per rectum, imaging showing near obstructing sigmoid colon mass.  She underwent lower endoscopy and was found to have a near obstructing tumor of the sigmoid colon.    Pathology showed "moderately differentiated adenocarcinoma, extensively ulcerated with extension through muscularis propria into pericolic adipose tissue.  Maximum tumor size 5 cm with significant luminal obstruction.  Tumor extends to within 0.1 cm of the inked serosal surface.  No tumor within lymphatics.  Radial surgical margin is negative for malignancy.  Proximal and distal mucosal surgical margins are negative for malignancy.  Benign right ovary.  No tumor identified within serosal addition of: With ovary.  15 lymph nodes negative  for malignancy."  Patient reports she underwent 6 months of chemotherapy.  Repeat CT scan from March 2017 under the scanned chart showed no significant interval change, and no CT findings to suggest recurrent or metastatic disease.  Patient's lab work also shows microcytosis with MCV in the high 70s.  No previous EGDs.  The patient denies abdominal or flank pain, anorexia, nausea or vomiting, dysphagia, change in bowel habits or black or bloody stools or weight loss.    Past Medical History:  Diagnosis Date  . Colon cancer ([place])   . Hypertension     Past Surgical History:  Procedure Laterality Date  . CHOLECYSTECTOMY    . COLON SURGERY  2016   colon resection    Prior to Admission medications   Medication Sig Start Date End Date Taking? Authorizing Provider  lisinopril (PRINIVIL,ZESTRIL) 20 MG tablet TAKE 1 TABLET BY MOUTH DAILY 03/05/18  Yes Joyce College, NP  omeprazole (PRILOSEC) 20 MG capsule Take 1 capsule (20 mg total) by mouth daily. 04/07/18  Yes Joyce College, NP  PARoxetine (PAXIL) 10 MG tablet Take 1 tablet (10 mg total) by mouth daily. 04/07/18  Yes Joyce College, NP  simvastatin (ZOCOR) 40 MG tablet Take 1 tablet (40 mg total) by mouth at bedtime. 04/09/18  Yes Joyce College, NP    Family History  Problem Relation Age of Onset  . Hyperlipidemia Mother   . Hypertension Mother   . Diabetes Father   . Hypertension Sister   . Hyperlipidemia Sister   . Hyperlipidemia Brother   . Hypertension Brother   . Ovarian cancer Daughter   .  Healthy Son   . Heart disease Maternal Grandmother   . Heart disease Maternal Grandfather   . Heart disease Paternal Grandmother   . Heart disease Paternal Grandfather   . Hyperlipidemia Sister   . Healthy Daughter      Social History   Tobacco Use  . Smoking status: Never Smoker  . Smokeless tobacco: Never Used  Substance Use Topics  . Alcohol use: No  . Drug use: No    Allergies as of 04/17/2018   . (No Known Allergies)    Review of Systems:    All systems reviewed and negative except where noted in HPI.   Physical Exam:  BP (!) 149/81   Pulse 70   Ht 5' (1.524 m)   Wt 153 lb (69.4 kg)   BMI 29.88 kg/m  No LMP recorded. Patient is postmenopausal. Psych:  Alert and cooperative. Normal mood and affect. General:   Alert,  Well-developed, well-nourished, pleasant and cooperative in NAD Head:  Normocephalic and atraumatic. Eyes:  Sclera clear, no icterus.   Conjunctiva pink. Ears:  Normal auditory acuity. Nose:  No deformity, discharge, or lesions. Mouth:  No deformity or lesions,oropharynx pink & moist. Neck:  Supple; no masses or thyromegaly. Lungs:  Respirations even and unlabored.  Clear throughout to auscultation.   No wheezes, crackles, or rhonchi. No acute distress. Heart:  Regular rate and rhythm; no murmurs, clicks, rubs, or gallops. Abdomen:  Normal bowel sounds.  No bruits.  Soft, non-tender and non-distended without masses, hepatosplenomegaly or hernias noted.  No guarding or rebound tenderness.    Msk:  Symmetrical without gross deformities. Good, equal movement & strength bilaterally. Pulses:  Normal pulses noted. Extremities:  No clubbing or edema.  No cyanosis. Neurologic:  Alert and oriented x3;  grossly normal neurologically. Skin:  Intact without significant lesions or rashes. No jaundice. Lymph Nodes:  No significant cervical adenopathy. Psych:  Alert and cooperative. Normal mood and affect.   Labs: CBC    Component Value Date/Time   WBC 4.4 04/08/2018 0829   RBC 4.94 04/08/2018 0829   HGB 12.2 04/08/2018 0829   HCT 37.7 04/08/2018 0829   PLT 209 04/08/2018 0829   MCV 76.3 (L) 04/08/2018 0829   MCH 24.7 (L) 04/08/2018 0829   MCHC 32.4 04/08/2018 0829   RDW 13.5 04/08/2018 0829   LYMPHSABS 1,558 04/08/2018 0829   EOSABS 251 04/08/2018 0829   BASOSABS 40 04/08/2018 0829   CMP     Component Value Date/Time   NA 140 04/08/2018 0829   K 4.5  04/08/2018 0829   CL 106 04/08/2018 0829   CO2 29 04/08/2018 0829   GLUCOSE 100 (H) 04/08/2018 0829   BUN 15 04/08/2018 0829   CREATININE 0.70 04/08/2018 0829   CALCIUM 9.3 04/08/2018 0829   PROT 6.8 04/08/2018 0829   AST 17 04/08/2018 0829   ALT 11 04/08/2018 0829   BILITOT 1.3 (H) 04/08/2018 0829   GFRNONAA 91 04/08/2018 0829   GFRAA 105 04/08/2018 0829    Imaging Studies: No results found.  Assessment and Plan:   Lekesha Claw is a 67 y.o. y/o female has been referred for evaluation for colonoscopy, with history of colon cancer in 2014, status post sigmoid colectomy with left salpingo-oophorectomy, and status post chemotherapy, also noted to have microcytosis on recent labs  Patient is clinically completely asymptomatic Colonoscopy for high-risk screen is indicated at this time Patient is also noted to have microcytosis on labs Will order ferritin and iron work-up EGD  is indicated due to microcytosis, to rule out any other underlying causes of iron deficiency Patient educated on avoiding NSAIDs  Patient states she is establishing care here, and primary care physician is planning on referring patient to oncology to establish care here as well given her history of colon cancer.  Would recommend primary care to go ahead with this referral, and they would be able to evaluate her microcytosis as well.  I have discussed alternative options, risks & benefits,  which include, but are not limited to, bleeding, infection, perforation,respiratory complication & drug reaction.  The patient agrees with this plan & written consent will be obtained.     Dr Joyce Pace

## 2018-04-18 ENCOUNTER — Telehealth: Payer: Self-pay

## 2018-04-18 MED ORDER — PEG 3350-KCL-NA BICARB-NACL 420 G PO SOLR: ORAL | 0 refills | Status: DC

## 2018-04-18 MED ORDER — BISACODYL 5 MG PO TBEC: 10.0000 mg | DELAYED_RELEASE_TABLET | Freq: Once | ORAL | 0 refills | Status: AC

## 2018-04-18 NOTE — Telephone Encounter (Signed)
I spoke with pt regarding getting lab work done and where to go and also if she would agree to having an EGD done in addition to her Colonoscopy due to low iron. Pt in agreement. Reassured.

## 2018-04-25 LAB — FERRITIN: Ferritin: 63 ng/mL (ref 15–150)

## 2018-04-25 LAB — IRON AND TIBC
IRON SATURATION: 21 % (ref 15–55)
IRON: 65 ug/dL (ref 27–139)
Total Iron Binding Capacity: 307 ug/dL (ref 250–450)
UIBC: 242 ug/dL (ref 118–369)

## 2018-04-25 LAB — TRANSFERRIN: Transferrin: 258 mg/dL (ref 200–370)

## 2018-04-28 ENCOUNTER — Other Ambulatory Visit: Payer: Self-pay | Admitting: Nurse Practitioner

## 2018-04-28 DIAGNOSIS — Z85038 Personal history of other malignant neoplasm of large intestine: Secondary | ICD-10-CM

## 2018-04-28 DIAGNOSIS — I10 Essential (primary) hypertension: Secondary | ICD-10-CM

## 2018-04-29 ENCOUNTER — Telehealth: Payer: Self-pay | Admitting: Gastroenterology

## 2018-04-29 NOTE — Telephone Encounter (Signed)
Pt rescheduled her procedures (EGD/Colonoscopy) from 05/01/2018 to 05/22/2018.  Pt's mother is ill who lives in [place].

## 2018-04-29 NOTE — Telephone Encounter (Signed)
Pt is calling to reschedule her procedure

## 2018-05-01 ENCOUNTER — Ambulatory Visit: Payer: Medicare Other | Admitting: Oncology

## 2018-05-06 ENCOUNTER — Inpatient Hospital Stay: Payer: Medicare Other | Attending: Oncology | Admitting: Oncology

## 2018-05-14 ENCOUNTER — Ambulatory Visit: Payer: Self-pay | Admitting: Gastroenterology

## 2018-05-21 ENCOUNTER — Telehealth: Payer: Self-pay | Admitting: Gastroenterology

## 2018-05-21 NOTE — Telephone Encounter (Signed)
Questions answered.

## 2018-05-21 NOTE — Telephone Encounter (Signed)
Patient LVM that she has some questions regarding her procedure tomorrow. Please call today.

## 2018-05-22 ENCOUNTER — Encounter: Payer: Self-pay | Admitting: Anesthesiology

## 2018-05-22 ENCOUNTER — Encounter: Admission: RE | Payer: Self-pay | Source: Ambulatory Visit

## 2018-05-22 ENCOUNTER — Other Ambulatory Visit: Payer: Self-pay

## 2018-05-22 ENCOUNTER — Ambulatory Visit: Admission: RE | Admit: 2018-05-22 | Payer: Medicare Other | Source: Ambulatory Visit | Admitting: Gastroenterology

## 2018-05-22 ENCOUNTER — Telehealth: Payer: Self-pay | Admitting: Gastroenterology

## 2018-05-22 DIAGNOSIS — Z9189 Other specified personal risk factors, not elsewhere classified: Secondary | ICD-10-CM

## 2018-05-22 DIAGNOSIS — Z85038 Personal history of other malignant neoplasm of large intestine: Secondary | ICD-10-CM

## 2018-05-22 DIAGNOSIS — R718 Other abnormality of red blood cells: Secondary | ICD-10-CM

## 2018-05-22 SURGERY — COLONOSCOPY WITH PROPOFOL
Anesthesia: General

## 2018-05-22 NOTE — Telephone Encounter (Signed)
PT left vm to reschedule procedure

## 2018-05-30 ENCOUNTER — Inpatient Hospital Stay: Payer: Medicare Other | Attending: Oncology | Admitting: Oncology

## 2018-06-11 ENCOUNTER — Encounter: Payer: Self-pay | Admitting: Student

## 2018-06-12 ENCOUNTER — Other Ambulatory Visit: Payer: Self-pay

## 2018-06-12 ENCOUNTER — Ambulatory Visit: Payer: Medicare Other | Admitting: Registered Nurse

## 2018-06-12 ENCOUNTER — Encounter
Admission: RE | Disposition: A | Discharge status: Home / Self Care | Payer: Self-pay | Source: Ambulatory Visit | Attending: Gastroenterology

## 2018-06-12 ENCOUNTER — Ambulatory Visit
Admission: RE | Admit: 2018-06-12 | Discharge: 2018-06-12 | Disposition: A | Discharge status: Home / Self Care | Payer: Medicare Other | Source: Ambulatory Visit | Attending: Gastroenterology | Admitting: Gastroenterology

## 2018-06-12 DIAGNOSIS — K319 Disease of stomach and duodenum, unspecified: Secondary | ICD-10-CM | POA: Insufficient documentation

## 2018-06-12 DIAGNOSIS — Z8500 Personal history of malignant neoplasm of unspecified digestive organ: Secondary | ICD-10-CM

## 2018-06-12 DIAGNOSIS — Z1211 Encounter for screening for malignant neoplasm of colon: Secondary | ICD-10-CM | POA: Insufficient documentation

## 2018-06-12 DIAGNOSIS — F419 Anxiety disorder, unspecified: Secondary | ICD-10-CM | POA: Insufficient documentation

## 2018-06-12 DIAGNOSIS — K2270 Barrett's esophagus without dysplasia: Secondary | ICD-10-CM

## 2018-06-12 DIAGNOSIS — R718 Other abnormality of red blood cells: Secondary | ICD-10-CM | POA: Insufficient documentation

## 2018-06-12 DIAGNOSIS — I10 Essential (primary) hypertension: Secondary | ICD-10-CM | POA: Diagnosis not present

## 2018-06-12 DIAGNOSIS — Z9049 Acquired absence of other specified parts of digestive tract: Secondary | ICD-10-CM | POA: Diagnosis not present

## 2018-06-12 DIAGNOSIS — K648 Other hemorrhoids: Secondary | ICD-10-CM | POA: Diagnosis not present

## 2018-06-12 DIAGNOSIS — K317 Polyp of stomach and duodenum: Secondary | ICD-10-CM | POA: Diagnosis not present

## 2018-06-12 DIAGNOSIS — F329 Major depressive disorder, single episode, unspecified: Secondary | ICD-10-CM | POA: Insufficient documentation

## 2018-06-12 DIAGNOSIS — Z8719 Personal history of other diseases of the digestive system: Secondary | ICD-10-CM

## 2018-06-12 DIAGNOSIS — D125 Benign neoplasm of sigmoid colon: Secondary | ICD-10-CM

## 2018-06-12 DIAGNOSIS — K3189 Other diseases of stomach and duodenum: Secondary | ICD-10-CM | POA: Diagnosis not present

## 2018-06-12 DIAGNOSIS — Z9189 Other specified personal risk factors, not elsewhere classified: Secondary | ICD-10-CM

## 2018-06-12 DIAGNOSIS — Z8249 Family history of ischemic heart disease and other diseases of the circulatory system: Secondary | ICD-10-CM | POA: Insufficient documentation

## 2018-06-12 DIAGNOSIS — Z85038 Personal history of other malignant neoplasm of large intestine: Secondary | ICD-10-CM | POA: Diagnosis not present

## 2018-06-12 DIAGNOSIS — K635 Polyp of colon: Secondary | ICD-10-CM

## 2018-06-12 HISTORY — PX: COLONOSCOPY WITH PROPOFOL: SHX5780

## 2018-06-12 HISTORY — PX: ESOPHAGOGASTRODUODENOSCOPY (EGD) WITH PROPOFOL: SHX5813

## 2018-06-12 HISTORY — DX: Anxiety disorder, unspecified: F41.9

## 2018-06-12 SURGERY — COLONOSCOPY WITH PROPOFOL
Anesthesia: General

## 2018-06-12 MED ORDER — LIDOCAINE HCL (CARDIAC) PF 100 MG/5ML IV SOSY
PREFILLED_SYRINGE | INTRAVENOUS | Status: DC | PRN
  Administered 2018-06-12: 40 mg via INTRAVENOUS

## 2018-06-12 MED ORDER — GLYCOPYRROLATE 0.2 MG/ML IJ SOLN
INTRAMUSCULAR | Status: DC | PRN
  Administered 2018-06-12: 0.2 mg via INTRAVENOUS

## 2018-06-12 MED ORDER — PROPOFOL 10 MG/ML IV BOLUS
INTRAVENOUS | Status: AC
  Filled 2018-06-12: qty 20

## 2018-06-12 MED ORDER — GLYCOPYRROLATE 0.2 MG/ML IJ SOLN
INTRAMUSCULAR | Status: AC
  Filled 2018-06-12: qty 1

## 2018-06-12 MED ORDER — PROPOFOL 500 MG/50ML IV EMUL
INTRAVENOUS | Status: DC | PRN
  Administered 2018-06-12: 150 ug/kg/min via INTRAVENOUS

## 2018-06-12 MED ORDER — MIDAZOLAM HCL 2 MG/2ML IJ SOLN
INTRAMUSCULAR | Status: AC
  Filled 2018-06-12: qty 2

## 2018-06-12 MED ORDER — MIDAZOLAM HCL 2 MG/2ML IJ SOLN
INTRAMUSCULAR | Status: DC | PRN
  Administered 2018-06-12: 2 mg via INTRAVENOUS

## 2018-06-12 MED ORDER — PHENYLEPHRINE HCL 10 MG/ML IJ SOLN
INTRAMUSCULAR | Status: DC | PRN
  Administered 2018-06-12: 100 ug via INTRAVENOUS

## 2018-06-12 MED ORDER — SODIUM CHLORIDE 0.9 % IV SOLN
INTRAVENOUS | Status: DC
  Administered 2018-06-12: 09:00:00 via INTRAVENOUS

## 2018-06-12 MED ORDER — LIDOCAINE HCL (PF) 2 % IJ SOLN
INTRAMUSCULAR | Status: AC
  Filled 2018-06-12: qty 10

## 2018-06-12 MED ORDER — PROPOFOL 10 MG/ML IV BOLUS
INTRAVENOUS | Status: DC | PRN
  Administered 2018-06-12: 50 mg via INTRAVENOUS

## 2018-06-12 NOTE — Anesthesia Preprocedure Evaluation (Addendum)
Anesthesia Evaluation  Patient identified by MRN, date of birth, ID band Patient awake    Reviewed: Allergy & Precautions, H&P , NPO status , Patient's Chart, lab work & pertinent test results, reviewed documented beta blocker date and time   Airway Mallampati: I  TM Distance: >3 FB Neck ROM: full    Dental  (+) Dental Advidsory Given, Teeth Intact   Pulmonary neg pulmonary ROS,           Cardiovascular Exercise Tolerance: Good hypertension, (-) angina(-) CAD, (-) Past MI, (-) Cardiac Stents and (-) CABG (-) dysrhythmias (-) Valvular Problems/Murmurs     Neuro/Psych PSYCHIATRIC DISORDERS Anxiety Depression negative neurological ROS     GI/Hepatic Neg liver ROS, GERD  ,  Endo/Other  negative endocrine ROS  Renal/GU negative Renal ROS  negative genitourinary   Musculoskeletal   Abdominal   Peds  Hematology negative hematology ROS (+)   Anesthesia Other Findings Past Medical History: No date: Anxiety 2014: Colon cancer ([place]) No date: Hypertension   Reproductive/Obstetrics negative OB ROS                             Anesthesia Physical Anesthesia Plan  ASA: II  Anesthesia Plan: General   Post-op Pain Management:    Induction: Intravenous  PONV Risk Score and Plan: 3 and Propofol infusion and TIVA  Airway Management Planned: Nasal Cannula  Additional Equipment:   Intra-op Plan:   Post-operative Plan:   Informed Consent: I have reviewed the patients History and Physical, chart, labs and discussed the procedure including the risks, benefits and alternatives for the proposed anesthesia with the patient or authorized representative who has indicated his/her understanding and acceptance.   Dental Advisory Given  Plan Discussed with: Anesthesiologist, CRNA and Surgeon  Anesthesia Plan Comments:        Anesthesia Quick Evaluation

## 2018-06-12 NOTE — H&P (Signed)
Joyce Antigua, MD [address], [place], [place], [place], [address] [address], [place], [place], [place], [address] Phone: [phone]  Fax: [phone]  Primary Care Physician:  Mikey College, NP   Pre-Procedure History & Physical: HPI:  Joyce Pace is a 67 y.o. female is here for a colonoscopy and EGD.   Past Medical History:  Diagnosis Date  . Anxiety   . Colon cancer ([facility]) 2014  . Hypertension     Past Surgical History:  Procedure Laterality Date  . CHOLECYSTECTOMY    . COLON SURGERY  2016   colon resection    Prior to Admission medications   Medication Sig Start Date End Date Taking? Authorizing Provider  lisinopril (PRINIVIL,ZESTRIL) 20 MG tablet TAKE 1 TABLET BY MOUTH DAILY 04/28/18   Mikey College, NP  omeprazole (PRILOSEC) 20 MG capsule Take 1 capsule (20 mg total) by mouth daily. 04/07/18   Mikey College, NP  PARoxetine (PAXIL) 10 MG tablet Take 1 tablet (10 mg total) by mouth daily. 04/07/18   Mikey College, NP  polyethylene glycol-electrolytes (NULYTELY/GOLYTELY) 420 g solution Use as directed in split doses. 04/18/18   Virgel Manifold, MD  simvastatin (ZOCOR) 40 MG tablet Take 1 tablet (40 mg total) by mouth at bedtime. 04/09/18   Mikey College, NP    Allergies as of 05/22/2018  . (No Known Allergies)    Family History  Problem Relation Age of Onset  . Hyperlipidemia Mother   . Hypertension Mother   . Diabetes Father   . Hypertension Sister   . Hyperlipidemia Sister   . Hyperlipidemia Brother   . Hypertension Brother   . Ovarian cancer Daughter   . Healthy Son   . Heart disease Maternal Grandmother   . Heart disease Maternal Grandfather   . Heart disease Paternal Grandmother   . Heart disease Paternal Grandfather   . Hyperlipidemia Sister   . Healthy Daughter     Social History   Socioeconomic History  . Marital status: Married    Spouse name: Not on file  . Number of children: Not  on file  . Years of education: Not on file  . Highest education level: High school graduate  Occupational History  . Not on file  Social Needs  . Financial resource strain: Not very hard  . Food insecurity:    Worry: Never true    Inability: Never true  . Transportation needs:    Medical: No    Non-medical: No  Tobacco Use  . Smoking status: Never Smoker  . Smokeless tobacco: Never Used  Substance and Sexual Activity  . Alcohol use: No  . Drug use: No  . Sexual activity: Not Currently  Lifestyle  . Physical activity:    Days per week: 2 days    Minutes per session: 30 min  . Stress: To some extent  Relationships  . Social connections:    Talks on phone: Not on file    Gets together: Not on file    Attends religious service: More than 4 times per year    Active member of club or organization: Yes    Attends meetings of clubs or organizations: More than 4 times per year    Relationship status: Married  . Intimate partner violence:    Fear of current or ex partner: No    Emotionally abused: No    Physically abused: No    Forced sexual activity: No  Other Topics Concern  .  Not on file  Social History Narrative  . Not on file    Review of Systems: See HPI, otherwise negative ROS  Physical Exam: BP (!) 173/84   Pulse 82   Temp (!) 95.7 F (35.4 C) (Tympanic)   Resp 20   Ht 5\' 2"  (1.575 m)   Wt 68.5 kg   SpO2 100%   BMI 27.62 kg/m  General:   Alert,  pleasant and cooperative in NAD Head:  Normocephalic and atraumatic. Neck:  Supple; no masses or thyromegaly. Lungs:  Clear throughout to auscultation, normal respiratory effort.    Heart:  +S1, +S2, Regular rate and rhythm, No edema. Abdomen:  Soft, nontender and nondistended. Normal bowel sounds, without guarding, and without rebound.   Neurologic:  Alert and  oriented x4;  grossly normal neurologically.  Impression/Plan: Joyce Pace is here for a colonoscopy to be performed for high risk screening  due to personal history of colon cancer s/p resection in 2014 and EGD for Microcytosis.  Risks, benefits, limitations, and alternatives regarding the procedures have been reviewed with the patient.  Questions have been answered.  All parties agreeable.   Virgel Manifold, MD  06/12/2018, 8:59 AM

## 2018-06-12 NOTE — Op Note (Signed)
[facility] Patient Name: Kinzleigh Kandler Procedure Date: 06/12/2018 8:43 AM MRN: [id number] Account #: [id number] Date of Birth: 09-11-1951 Admit Type: Outpatient Age: 67 Room: [facility] ENDO ROOM 2 Gender: Female Note Status: Finalized Procedure:            Colonoscopy of Post-surgical Anatomy Indications:          History of sigmoid colectomy, Personal history of                        malignant neoplasm of the colon, High risk colon cancer                        surveillance: Personal history of colon cancer Providers:            Ihan Pat B. Bonna Gains MD, MD Medicines:            Monitored Anesthesia Care Complications:        No immediate complications. Procedure:            Pre-Anesthesia Assessment:                       - ASA Grade Assessment: II - A patient with mild                        systemic disease.                       - Prior to the procedure, a History and Physical was                        performed, and patient medications, allergies and                        sensitivities were reviewed. The patient's tolerance of                        previous anesthesia was reviewed.                       - The risks and benefits of the procedure and the                        sedation options and risks were discussed with the                        patient. All questions were answered and informed                        consent was obtained.                       - Patient identification and proposed procedure were                        verified prior to the procedure by the physician, the                        nurse, the anesthesiologist, the anesthetist and the                        technician. The procedure  was verified in the procedure                        room.                       After obtaining informed consent, the endoscope was                        passed under direct vision. Throughout the procedure,       the patient's blood pressure, pulse, and oxygen                        saturations were monitored continuously. The                        Colonoscope was introduced through the anus and                        advanced to the the cecum, identified by appendiceal                        orifice and ileocecal valve. After obtaining informed                        consent, the endoscope was passed under direct vision.                        Throughout the procedure, the patient's blood pressure,                        pulse, and oxygen saturations were monitored                        continuously.The colonoscopy was performed with ease.                        The patient tolerated the procedure well. The quality                        of the bowel preparation was good. Findings:      The perianal and digital rectal examinations were normal.      A 5 mm polyp was found in the sigmoid colon. The polyp was flat. The       polyp was removed with a cold snare. Resection and retrieval were       complete.      Patient is status-post sigmoid colectomy with a surgical anastomosis.      The anastomosis appeared normal.      The exam was otherwise without abnormality.      Non-bleeding internal hemorrhoids were found during retroflexion. Impression:           - One 5 mm polyp in the sigmoid colon, removed with a                        cold snare. Resected and retrieved.                       - The colonic anastomosis is normal.                       -  The examination was otherwise normal.                       - Non-bleeding internal hemorrhoids. Recommendation:       - Discharge patient to home (with escort).                       - Advance diet as tolerated.                       - Continue present medications.                       - Await pathology results.                       - Repeat colonoscopy in 5 years for surveillance.                       - The findings and recommendations were  discussed with                        the patient.                       - The findings and recommendations were discussed with                        the patient's family.                       - Return to primary care physician as previously                        scheduled.                       - High fiber diet.                       [facility] Loffler care provider to consider workup or referral                        to hematology for Microcytosis on bloodwork. Procedure Code(s):    --- Professional ---                       [phone], Colonoscopy, flexible; with removal of tumor(s),                        polyp(s), or other lesion(s) by snare technique Diagnosis Code(s):    --- Professional ---                       Y85.027, Personal history of other malignant neoplasm                        of large intestine                       D12.5, Benign neoplasm of sigmoid colon                       K64.8, Other hemorrhoids  Z90.49, Acquired absence of other specified parts of                        digestive tract CPT copyright 2017 American Medical Association. All rights reserved. The codes documented in this report are preliminary and upon coder review may  be revised to meet current compliance requirements.  Vonda Antigua, MD Margretta Sidle B. Bonna Gains MD, MD 06/12/2018 10:03:19 AM This report has been signed electronically. Number of Addenda: 0 Note Initiated On: 06/12/2018 8:43 AM Scope Withdrawal Time: 0 hours 16 minutes 40 seconds  Total Procedure Duration: 0 hours 23 minutes 47 seconds  Estimated Blood Loss: Estimated blood loss: none.      [facility]

## 2018-06-12 NOTE — Op Note (Signed)
[facility]enter[facility] Patient Name: Joyce Pace Procedure Date: 06/12/2018 8:44 AM MRN: [id number] Account #: [id number] Date of Birth: [birth year] Admit Type: Outpatient Age: 67 Room: [facility] ENDO ROOM 2 Gender: Female Note Status: Finalized Procedure:            Upper GI endoscopy Indications:          Personal history of digestive disease (unspecified),                        Personal history of malignant neoplasm of the GI tract,                        Microcytosis Providers:            Varnita B. Bonna Gains MD, MD, Lucretia Kern MD, MD Referring MD:         Olin Hauser (Referring MD) Medicines:            Monitored Anesthesia Care Complications:        No immediate complications. Procedure:            Pre-Anesthesia Assessment:                       - Prior to the procedure, a History and Physical was                        performed, and patient medications, allergies and                        sensitivities were reviewed. The patient's tolerance of                        previous anesthesia was reviewed.                       - The risks and benefits of the procedure and the                        sedation options and risks were discussed with the                        patient. All questions were answered and informed                        consent was obtained.                       - Patient identification and proposed procedure were                        verified prior to the procedure by the physician, the                        nurse, the anesthesiologist, the anesthetist and the                        technician. The procedure was verified in the procedure                        room.                       -  ASA Grade Assessment: II - A patient with mild                        systemic disease.                       After obtaining informed consent, the endoscope was                        passed under direct vision.  Throughout the procedure,                        the patient's blood pressure, pulse, and oxygen                        saturations were monitored continuously. The Endoscope                        was introduced through the mouth, and advanced to the                        second part of duodenum. The upper GI endoscopy was                        accomplished with ease. The patient tolerated the                        procedure well. Findings:      One tongue of salmon-colored mucosa was present from 34 to 35 cm. No       other visible abnormalities were present. The maximum longitudinal       extent of these esophageal mucosal changes was 1 cm in length. Biopsies       were taken with a cold forceps for histology. 4 quadrant biopsies were       done for evaluation for Barrett's esophagus.      A single 4 mm sessile polyp with no bleeding and no stigmata of recent       bleeding was found in the gastric fundus. Biopsies were taken with a       cold forceps for histology.      Patchy mildly erythematous mucosa without bleeding was found in the       gastric antrum. Biopsies were taken with a cold forceps for histology.       Biopsies were obtained in the gastric body, at the incisura and in the       gastric antrum with cold forceps for histology.      The duodenal bulb, second portion of the duodenum and examined duodenum       were normal. Impression:           - Salmon-colored mucosa classified as Barrett's stage                        C0-M1 per [place] criteria. Biopsied.                       - A single gastric polyp. Biopsied.                       - Erythematous mucosa in the antrum. Biopsied.                       -  Normal duodenal bulb, second portion of the duodenum                        and examined duodenum.                       - Biopsies were obtained in the gastric body, at the                        incisura and in the gastric antrum. Recommendation:       - Await  pathology results.                       - Discharge patient to home (with escort).                       - Advance diet as tolerated.                       - Continue present medications.                       - Patient has a contact number available for                        emergencies. The signs and symptoms of potential                        delayed complications were discussed with the patient.                        Return to normal activities tomorrow. Written discharge                        instructions were provided to the patient.                       - Discharge patient to home (with escort).                       - The findings and recommendations were discussed with                        the patient.                       - The findings and recommendations were discussed with                        the patient's family.                       - Follow an antireflux regimen. Procedure Code(s):    --- Professional ---                       [phone], Esophagogastroduodenoscopy, flexible, transoral;                        with biopsy, single or multiple Diagnosis Code(s):    --- Professional ---                       K22.70, Barrett's esophagus  without dysplasia                       K31.7, Polyp of stomach and duodenum                       K31.89, Other diseases of stomach and duodenum                       Z87.19, Personal history of other diseases of the                        digestive system                       Z85.00, Personal history of malignant neoplasm of                        unspecified digestive organ CPT copyright 2017 American Medical Association. All rights reserved. The codes documented in this report are preliminary and upon coder review may  be revised to meet current compliance requirements.  Vonda Antigua, MD Margretta Sidle B. Bonna Gains MD, MD 06/12/2018 9:31:04 AM This report has been signed electronically. Lucretia Kern MD, MD Number of Addenda:  0 Note Initiated On: 06/12/2018 8:44 AM Total Procedure Duration: 0 hours 14 minutes 25 seconds  Estimated Blood Loss: Estimated blood loss: none.      [facility]

## 2018-06-12 NOTE — Anesthesia Post-op Follow-up Note (Signed)
Anesthesia QCDR form completed.        

## 2018-06-12 NOTE — Transfer of Care (Signed)
Immediate Anesthesia Transfer of Care Note  Patient: Joyce Pace  Procedure(s) Performed: Procedure(s): COLONOSCOPY WITH PROPOFOL (N/A) ESOPHAGOGASTRODUODENOSCOPY (EGD) WITH PROPOFOL (N/A)  Patient Location: PACU and Endoscopy Unit  Anesthesia Type:General  Level of Consciousness: sedated  Airway & Oxygen Therapy: Patient Spontanous Breathing and Patient connected to nasal cannula oxygen  Post-op Assessment: Report given to RN and Post -op Vital signs reviewed and stable  Post vital signs: Reviewed and stable  Last Vitals:  Vitals:   06/12/18 0833 06/12/18 1005  BP: (!) 173/84 (!) 97/50  Pulse: 82 90  Resp: 20 (!) 21  Temp: (!) 35.4 C (!) 36.1 C  SpO2: 073% 54%    Complications: No apparent anesthesia complications

## 2018-06-12 NOTE — Anesthesia Postprocedure Evaluation (Signed)
Anesthesia Post Note  Patient: Joyce Pace  Procedure(s) Performed: COLONOSCOPY WITH PROPOFOL (N/A ) ESOPHAGOGASTRODUODENOSCOPY (EGD) WITH PROPOFOL (N/A )  Patient location during evaluation: Endoscopy Anesthesia Type: General Level of consciousness: awake and alert Pain management: pain level controlled Vital Signs Assessment: post-procedure vital signs reviewed and stable Respiratory status: spontaneous breathing, nonlabored ventilation, respiratory function stable and patient connected to nasal cannula oxygen Cardiovascular status: blood pressure returned to baseline and stable Postop Assessment: no apparent nausea or vomiting Anesthetic complications: no     Last Vitals:  Vitals:   06/12/18 1020 06/12/18 1030  BP: 117/82 110/72  Pulse: 75 76  Resp: 17 16  Temp:    SpO2: 100% 100%    Last Pain:  Vitals:   06/12/18 1030  TempSrc:   PainSc: 0-No pain                 Martha Clan

## 2018-06-14 LAB — SURGICAL PATHOLOGY

## 2018-06-18 ENCOUNTER — Ambulatory Visit: Payer: Medicare Other | Admitting: Nurse Practitioner

## 2018-06-18 ENCOUNTER — Other Ambulatory Visit (HOSPITAL_COMMUNITY)
Admission: RE | Admit: 2018-06-18 | Discharge: 2018-06-18 | Disposition: A | Discharge status: Home / Self Care | Payer: Medicare Other | Source: Ambulatory Visit | Attending: Nurse Practitioner | Admitting: Nurse Practitioner

## 2018-06-18 ENCOUNTER — Other Ambulatory Visit: Payer: Self-pay

## 2018-06-18 ENCOUNTER — Encounter: Payer: Self-pay | Admitting: Nurse Practitioner

## 2018-06-18 ENCOUNTER — Ambulatory Visit (INDEPENDENT_AMBULATORY_CARE_PROVIDER_SITE_OTHER): Payer: Medicare Other | Admitting: Nurse Practitioner

## 2018-06-18 VITALS — BP 152/51 | HR 67 | Temp 98.4°F | Ht 62.0 in | Wt 152.8 lb

## 2018-06-18 DIAGNOSIS — Z1239 Encounter for other screening for malignant neoplasm of breast: Secondary | ICD-10-CM

## 2018-06-18 DIAGNOSIS — R1013 Epigastric pain: Secondary | ICD-10-CM | POA: Diagnosis not present

## 2018-06-18 DIAGNOSIS — Z13220 Encounter for screening for lipoid disorders: Secondary | ICD-10-CM

## 2018-06-18 DIAGNOSIS — Z114 Encounter for screening for human immunodeficiency virus [HIV]: Secondary | ICD-10-CM | POA: Diagnosis not present

## 2018-06-18 DIAGNOSIS — Z1159 Encounter for screening for other viral diseases: Secondary | ICD-10-CM

## 2018-06-18 DIAGNOSIS — Z13228 Encounter for screening for other metabolic disorders: Secondary | ICD-10-CM

## 2018-06-18 DIAGNOSIS — Z124 Encounter for screening for malignant neoplasm of cervix: Secondary | ICD-10-CM | POA: Diagnosis not present

## 2018-06-18 DIAGNOSIS — Z23 Encounter for immunization: Secondary | ICD-10-CM | POA: Diagnosis not present

## 2018-06-18 DIAGNOSIS — F329 Major depressive disorder, single episode, unspecified: Secondary | ICD-10-CM | POA: Diagnosis not present

## 2018-06-18 DIAGNOSIS — Z1231 Encounter for screening mammogram for malignant neoplasm of breast: Secondary | ICD-10-CM

## 2018-06-18 DIAGNOSIS — Z780 Asymptomatic menopausal state: Secondary | ICD-10-CM

## 2018-06-18 DIAGNOSIS — K219 Gastro-esophageal reflux disease without esophagitis: Secondary | ICD-10-CM

## 2018-06-18 DIAGNOSIS — Z130 Encounter for screening for diseases of the blood and blood-forming organs and certain disorders involving the immune mechanism: Secondary | ICD-10-CM

## 2018-06-18 DIAGNOSIS — Z1329 Encounter for screening for other suspected endocrine disorder: Secondary | ICD-10-CM

## 2018-06-18 DIAGNOSIS — I10 Essential (primary) hypertension: Secondary | ICD-10-CM

## 2018-06-18 DIAGNOSIS — Z0000 Encounter for general adult medical examination without abnormal findings: Secondary | ICD-10-CM | POA: Diagnosis not present

## 2018-06-18 DIAGNOSIS — Z604 Social exclusion and rejection: Secondary | ICD-10-CM

## 2018-06-18 MED ORDER — OMEPRAZOLE 20 MG PO CPDR: 20.0000 mg | DELAYED_RELEASE_CAPSULE | Freq: Every day | ORAL | 1 refills | Status: DC

## 2018-06-18 MED ORDER — PAROXETINE HCL 10 MG PO TABS
10.0000 mg | ORAL_TABLET | Freq: Every day | ORAL | 1 refills | Status: DC
Start: 2018-06-18 — End: 2018-12-01

## 2018-06-18 MED ORDER — LISINOPRIL 20 MG PO TABS: 20.0000 mg | ORAL_TABLET | Freq: Every day | ORAL | 1 refills | Status: DC

## 2018-06-18 MED ORDER — HYDROCHLOROTHIAZIDE 12.5 MG PO TABS: 12.5000 mg | ORAL_TABLET | Freq: Every day | ORAL | 3 refills | Status: DC

## 2018-06-18 MED ORDER — PAROXETINE HCL 10 MG PO TABS: 10.0000 mg | ORAL_TABLET | Freq: Every day | ORAL | 1 refills | Status: DC

## 2018-06-18 NOTE — Progress Notes (Signed)
Subjective:    Patient ID: Joyce Pace, female    DOB: [birth year], 67 y.o.   MRN: [id number]  Joyce Pace is a 67 y.o. female presenting on 06/18/2018 for Abdominal Pain (x 2 days, upper gastric pain that worsen) and Anxiety (situational anxiety)   HPI Abdominal Pain Patient describes difficulty with abdominal pain during and after eating.  Is no longer taking acid reflux medication because she ran out.  Since stopping, abdominal pain has worsened.  Patient is unable to sit upright and feels best in slightly reclined position.     Hypertension BP reading high today and with last GI visit. Patient requests recheck and medication management.   Pt denies headache, lightheadedness, dizziness, changes in vision, epistaxis, chest tightness/pressure, palpitations, leg swelling, sudden loss of speech or loss of consciousness.   Anxiety/Depression - Social Isolation Patient reports feeling isolated.  She states it when she is not leaving the house "I miss my home and I missed my son."  These were from [place] before she moved.  We involved with taking care of children when she was in [place] as well.  She is often tearful and states she wants to have things to do away from home.  She is making decorative signs, but states this is not enough for her here.  Depression screen [facility] 2/9 06/18/2018 04/07/2018 02/19/2018  Decreased Interest 0 0 0  Down, Depressed, Hopeless 3 0 1  PHQ - 2 Score 3 0 1  Altered sleeping 0 1 -  Tired, decreased energy 0 1 -  Change in appetite 0 0 -  Feeling bad or failure about yourself  0 0 -  Trouble concentrating 0 0 -  Moving slowly or fidgety/restless 0 0 -  Suicidal thoughts 0 0 -  PHQ-9 Score 3 2 -  Difficult doing work/chores Not difficult at all Not difficult at all -     Social History   Tobacco Use  . Smoking status: Never Smoker  . Smokeless tobacco: Never Used  Substance Use Topics  . Alcohol use: No  . Drug use: No    Review  of Systems Per HPI unless specifically indicated above     Objective:    BP (!) 152/51 (BP Location: Right Arm, Patient Position: Sitting, Cuff Size: Normal)   Pulse 67   Temp 98.4 F (36.9 C) (Oral)   Ht 5\' 2"  (1.575 m)   Wt 152 lb 12.8 oz (69.3 kg)   BMI 27.95 kg/m   Wt Readings from Last 3 Encounters:  06/18/18 152 lb 12.8 oz (69.3 kg)  06/12/18 151 lb (68.5 kg)  04/17/18 153 lb (69.4 kg)    Physical Exam  Constitutional: She is oriented to person, place, and time. She appears well-developed and well-nourished. No distress.  HENT:  Head: Normocephalic and atraumatic.  Cardiovascular: Normal rate, regular rhythm, S1 normal, S2 normal, normal heart sounds and intact distal pulses.  Pulmonary/Chest: Effort normal and breath sounds normal. No respiratory distress.  Abdominal: Soft. Normal appearance. Bowel sounds are increased. There is no hepatosplenomegaly, splenomegaly or hepatomegaly. There is no tenderness. No hernia.  Neurological: She is alert and oriented to person, place, and time.  Skin: Skin is warm and dry.  Psychiatric: She has a normal mood and affect. Her behavior is normal.  Vitals reviewed.      Assessment & Plan:   Problem List Items Addressed This Visit      Cardiovascular and Mediastinum   Essential hypertension Uncontrolled today on exam.  Medications tolerated without side effects.  Continue with changes. Continue lisinopril 20 mg once daily.  START hydrochlorothiazide 12.5 mg once daily.  Refills provided.  Check labs today. Followup 2 months.    Relevant Medications   hydrochlorothiazide (HYDRODIURIL) 12.5 MG tablet     Other   Social isolation and Reactive depression Patient continues doing well on paxil, but continues to struggle with social isolation and change of location with move to [place].  She is tearful during visit, but easily reoriented and expresses hope after suggestion of volunteering (school volunteer interests patient) and visiting  senior center for making friends in similar phase of life.  Encouraged patient to continue working to integrate into community.  Provided resources for senior center and patient verbalizes she will visit her nearest elementary school for volunteer opportunities.  - Continue paxil.  - Followup 2 months.    Epigastric pain and GERD Currently uncontrolled off medication.  Refill provided.  Continue omeprazole 20 mg po once daily.  Recommended patient continue with followup with Dr. Bonna Gains.  She is to call and schedule her followup appointment for unresolved symptoms.         Meds ordered this encounter  Medications  . DISCONTD: hydrochlorothiazide (HYDRODIURIL) 12.5 MG tablet    Sig: Take 1 tablet (12.5 mg total) by mouth daily.    Dispense:  90 tablet    Refill:  3    Order Specific Question:   Supervising Provider    Answer:   Olin Hauser [[id number]]  . DISCONTD: omeprazole (PRILOSEC) 20 MG capsule    Sig: Take 1 capsule (20 mg total) by mouth daily.    Dispense:  90 capsule    Refill:  1    Order Specific Question:   Supervising Provider    Answer:   Olin Hauser [[id number]]  . DISCONTD: lisinopril (PRINIVIL,ZESTRIL) 20 MG tablet    Sig: Take 1 tablet (20 mg total) by mouth daily.    Dispense:  90 tablet    Refill:  1    Order Specific Question:   Supervising Provider    Answer:   Olin Hauser [[id number]]  . DISCONTD: PARoxetine (PAXIL) 10 MG tablet    Sig: Take 1 tablet (10 mg total) by mouth daily.    Dispense:  90 tablet    Refill:  1    Order Specific Question:   Supervising Provider    Answer:   Olin Hauser [[id number]]  . hydrochlorothiazide (HYDRODIURIL) 12.5 MG tablet    Sig: Take 1 tablet (12.5 mg total) by mouth daily.    Dispense:  90 tablet    Refill:  3  . lisinopril (PRINIVIL,ZESTRIL) 20 MG tablet    Sig: Take 1 tablet (20 mg total) by mouth daily.    Dispense:  90 tablet    Refill:  1  . omeprazole (PRILOSEC) 20 MG capsule     Sig: Take 1 capsule (20 mg total) by mouth daily.    Dispense:  90 capsule    Refill:  1  . PARoxetine (PAXIL) 10 MG tablet    Sig: Take 1 tablet (10 mg total) by mouth daily.    Dispense:  90 tablet    Refill:  1    Follow up plan: Return in about 1 year (around 06/19/2019) for Medicare Wellness Visit with [place] 2 months for blood pressure with me.  Cassell Smiles, DNP, AGPCNP-BC Adult Gerontology Primary Care Nurse Practitioner [address]  [facility] 06/18/2018, 9:22 AM

## 2018-06-18 NOTE — Patient Instructions (Addendum)
Joyce Pace,  Thank you for taking time to come for your Medicare Wellness Visit. I appreciate your ongoing commitment to your health goals. Please review the following plan we discussed and let me know if I can assist you in the future.   These are the goals we discussed: Goals    . DIET - EAT MORE FRUITS AND VEGETABLES       This is a list of the screening recommended for you and due dates:  Health Maintenance  Topic Date Due  .  Hepatitis C: One time screening is recommended by Center for Disease Control  (CDC) for  adults born from [birth year] through 1963.   [birth year]  . HIV Screening  02/10/1968  . Tetanus Vaccine  02/10/1972  . Pap Smear  02/09/1974  . Mammogram  02/10/2003  . DEXA scan (bone density measurement)  02/09/2018  . Pneumonia vaccines (1 of 2 - PCV13) 02/09/2018  . Flu Shot  06/05/2018  . Colon Cancer Screening  06/12/2028    Please schedule a follow-up appointment with Cassell Smiles, AGNP. Return in about 1 year (around 06/19/2019) for Medicare Wellness Visit with [place] 2 months for blood pressure with me.   For blood pressure: - Continue lisinopril - START hydrochlorothiazide   For abdominal pain: - Take your acid reducer - Call Dr. Bonna Gains for followup appointment.  For anxiety and depression: - Start volunteering.   - Also, you may find benefit in [facility] activities.    If you have any other questions or concerns, please feel free to call the clinic or send a message through [place]. You may also schedule an earlier appointment if necessary.  You will receive a survey after today's visit either digitally by e-mail or paper by [employer]. Your experiences and feedback matter to [place].  Please respond so we know how we are doing as we provide care for you.   Cassell Smiles, DNP, AGNP-BC Adult Gerontology Nurse Practitioner [place]

## 2018-06-18 NOTE — Progress Notes (Signed)
Subjective:    Joyce Pace is a 67 y.o. female who presents for Medicare Initial preventive examination.  Preventive Screening-Counseling & Management Problems Prior to Visit (verified) Patient Active Problem List   Diagnosis Date Noted  . Polyp of sigmoid colon   . Internal hemorrhoids   . S/P partial colectomy   . Barrett's esophagus without dysplasia   . Gastric polyp   . Stomach irritation   . Personal history of digestive disease   . Personal history of malignant neoplasm of digestive organs   . Essential hypertension 02/19/2018  . Hyperlipidemia 02/19/2018  . History of colon cancer 02/19/2018  . Reactive depression 02/19/2018   Medications Prior to Visit (verified) Current Outpatient Medications on File Prior to Visit  Medication Sig Dispense Refill  . lisinopril (PRINIVIL,ZESTRIL) 20 MG tablet TAKE 1 TABLET BY MOUTH DAILY 90 tablet 1  . omeprazole (PRILOSEC) 20 MG capsule Take 1 capsule (20 mg total) by mouth daily. 90 capsule 1  . PARoxetine (PAXIL) 10 MG tablet Take 1 tablet (10 mg total) by mouth daily. 90 tablet 1  . polyethylene glycol-electrolytes (NULYTELY/GOLYTELY) 420 g solution Use as directed in split doses. 4000 mL 0  . simvastatin (ZOCOR) 40 MG tablet Take 1 tablet (40 mg total) by mouth at bedtime. 90 tablet 3   No current facility-administered medications on file prior to visit.    Allergies (verified) Patient has no known allergies.   PAST HISTORY Family History Family History  Problem Relation Age of Onset  . Hyperlipidemia Mother   . Hypertension Mother   . Diabetes Father   . Hypertension Sister   . Hyperlipidemia Sister   . Hyperlipidemia Brother   . Hypertension Brother   . Ovarian cancer Daughter   . Healthy Son   . Heart disease Maternal Grandmother   . Heart disease Maternal Grandfather   . Heart disease Paternal Grandmother   . Heart disease Paternal Grandfather   . Hyperlipidemia Sister   . Healthy Daughter     Social History Social History   Tobacco Use  . Smoking status: Never Smoker  . Smokeless tobacco: Never Used  Substance Use Topics  . Alcohol use: No    Are there smokers in your home (other than you)? Yes - but smoke outdoors  Risk Factors Current exercise habits: Home exercise routine includes treadmill and walking 1-2 hrs per week.  Dietary issues discussed: diet high in vegetables, fruits  Hospitalizations in last 1 year: no  Cardiac risk factors: dyslipidemia and hypertension.  Depression Screen Depression screen [facility] 2/9 06/18/2018 04/07/2018 02/19/2018  Decreased Interest 0 0 0  Down, Depressed, Hopeless 3 0 1  PHQ - 2 Score 3 0 1  Altered sleeping 0 1 -  Tired, decreased energy 0 1 -  Change in appetite 0 0 -  Feeling bad or failure about yourself  0 0 -  Trouble concentrating 0 0 -  Moving slowly or fidgety/restless 0 0 -  Suicidal thoughts 0 0 -  PHQ-9 Score 3 2 -  Difficult doing work/chores Not difficult at all Not difficult at all -    Activities of Daily Living In your present state of health, do you have any difficulty performing the following activities?:  Driving? No Managing money?  No Feeding yourself? No Getting from bed to chair? No Climbing a flight of stairs? No Preparing food and eating?: No Bathing or showering? No Getting dressed: No Getting to the toilet? No Using the toilet:No Moving around from  place to place: No In the past year have you fallen or had a near fall?:No   Are you sexually active?  Yes  Do you have more than one partner?  No  Hearing Difficulties: No Do you often ask people to speak up or repeat themselves? No Do you experience ringing or noises in your ears? No Do you have difficulty understanding soft or whispered voices? No   Do you feel that you have a problem with memory? No  Do you often misplace items? No  Do you feel safe at home?  Yes  Cognitive Testing  Alert? Yes  Normal Appearance?Yes  Oriented to  person? Yes  Place? Yes   Time? Yes  Displays appropriate judgment?Yes   6CIT Screen 06/18/2018  What Year? 0 points  What month? 0 points  What time? 0 points  Count back from 20 0 points  Months in reverse 0 points  Repeat phrase 2 points  Total Score 2    Advanced Directives  Advanced Directives have been discussed with the patient? Yes  List the Names of Other Physician/Practitioners you currently use: 1.  Dr. Bonna Gains  Medical Services received from non-[facility] providers in the past year: No - records from [place] previously requested.  There is no immunization history for the selected administration types on file for this patient.  Screening Tests Health Maintenance  Topic Date Due  . Hepatitis C Screening  18-Jun-1951  . HIV Screening  02/10/1968  . TETANUS/TDAP  02/10/1972  . PAP SMEAR  02/09/1974  . MAMMOGRAM  02/10/2003  . DEXA SCAN  02/09/2018  . PNA vac Low Risk Adult (1 of 2 - PCV13) 02/09/2018  . INFLUENZA VACCINE  06/05/2018  . COLONOSCOPY  06/12/2028    History reviewed: allergies, current medications, past family history, past medical history, past social history, past surgical history and problem list  Review of Systems Review of Systems  Constitutional: Negative for chills, diaphoresis, fever, malaise/fatigue and weight loss.  HENT: Negative for congestion, ear discharge, ear pain, hearing loss, nosebleeds, sinus pain, sore throat and tinnitus.   Eyes: Negative for blurred vision, double vision, photophobia, pain, discharge and redness.  Respiratory: Negative for cough, hemoptysis, sputum production, shortness of breath, wheezing and stridor.   Cardiovascular: Negative for chest pain, palpitations, orthopnea, claudication, leg swelling and PND.  Gastrointestinal: Positive for abdominal pain. Negative for blood in stool, constipation, diarrhea, heartburn, melena, nausea and vomiting.  Genitourinary: Negative for dysuria, flank pain, frequency,  hematuria and urgency.  Musculoskeletal: Negative for back pain, falls, joint pain, myalgias and neck pain.  Skin: Negative for itching and rash.  Neurological: Negative for dizziness, tingling, tremors, sensory change, speech change, focal weakness, seizures, loss of consciousness, weakness and headaches.  Endo/Heme/Allergies: Negative for environmental allergies and polydipsia. Does not bruise/bleed easily.  Psychiatric/Behavioral: Positive for depression (social isolation). Negative for hallucinations, memory loss, substance abuse and suicidal ideas. The patient is nervous/anxious. The patient does not have insomnia.      Objective:    BP (!) 152/51 (BP Location: Right Arm, Patient Position: Sitting, Cuff Size: Normal)   Pulse 67   Temp 98.4 F (36.9 C) (Oral)   Ht 5\' 2"  (1.575 m)   Wt 152 lb 12.8 oz (69.3 kg)   BMI 27.95 kg/m   No exam data present  [facility] Weights   06/18/18 0829  Weight: 152 lb 12.8 oz (69.3 kg)    Physical Exam  Constitutional: She is oriented to person, place, and time. She  appears well-developed and well-nourished. No distress.  HENT:  Head: Normocephalic and atraumatic.  Right Ear: External ear normal.  Left Ear: External ear normal.  Nose: Nose normal.  Mouth/Throat: Oropharynx is clear and moist.  Eyes: Pupils are equal, round, and reactive to light. Conjunctivae are normal.  Neck: Normal range of motion. Neck supple. No JVD present. No tracheal deviation present. No thyromegaly present.  Cardiovascular: Normal rate, regular rhythm, normal heart sounds and intact distal pulses. Exam reveals no gallop and no friction rub.  No murmur heard. Pulmonary/Chest: Effort normal and breath sounds normal. No respiratory distress.  Breast - Normal exam w/ symmetric breasts, no mass, no nipple discharge, no skin changes or tenderness.   Abdominal: Soft. Bowel sounds are normal. She exhibits no distension. There is no hepatosplenomegaly. There is no tenderness.    Genitourinary:  Genitourinary Comments: Normal external female genitalia without lesions or fusion. Vaginal canal without lesions. Normal appearing cervix without lesions or friability. Physiologic discharge on exam. Bimanual exam without adnexal masses, enlarged uterus, or cervical motion tenderness.  Musculoskeletal: Normal range of motion.  Lymphadenopathy:    She has no cervical adenopathy.  Neurological: She is alert and oriented to person, place, and time. No cranial nerve deficit.  Skin: Skin is warm and dry.  Psychiatric: She has a normal mood and affect. Her behavior is normal. Judgment and thought content normal.  Nursing note and vitals reviewed.  06/18/18 EKG: Normal Sinus Rhythm HR 73bpm PR 140ms  QT 474ms  QTc 442ms No axis deviation.    Assessment and Plan:    Problem List Items Addressed This Visit      Cardiovascular and Mediastinum   Essential hypertension   Relevant Medications   hydrochlorothiazide (HYDRODIURIL) 12.5 MG tablet    Other Visit Diagnoses    Need for vaccination with 13-polyvalent pneumococcal conjugate vaccine    -  Primary   Relevant Orders   Pneumococcal conjugate vaccine 13-valent IM   Cervical cancer screening       Relevant Orders   Cytology - PAP   Asymptomatic postmenopausal estrogen deficiency       Relevant Orders   DG Bone Density   Breast cancer screening       Relevant Orders   MM DIGITAL SCREENING BILATERAL   Need for hepatitis C screening test       Relevant Orders   Hepatitis C antibody   Screening for deficiency anemia       Relevant Orders   CBC with Differential/Platelet   Screening for lipid disorders       Relevant Orders   Lipid panel   Screening for thyroid disorder       Relevant Orders   TSH   Screening for metabolic disorder       Relevant Orders   COMPLETE METABOLIC PANEL WITH GFR   HIV screening declined       Relevant Orders   HIV antibody   Welcome to Medicare preventive visit       Relevant Orders    EKG 12-Lead      During the course of the visit the patient was educated and counseled about appropriate screening and preventive services including:    Pneumococcal vaccine   Screening electrocardiogram  Screening mammography  Screening Pap smear and pelvic exam   Bone densitometry screening  Diabetes screening  Nutrition counseling   Advanced directives: No advance directive.  Information given  Diet review for nutrition referral? no  Patient Instructions (the written  plan) was given to the patient.  Medicare Attestation I have personally reviewed: The patient's medical and social history Their use of alcohol, tobacco or illicit drugs Their current medications and supplements The patient's functional ability including ADLs,fall risks, home safety risks, cognitive, and hearing and visual impairment Diet and physical activities Evidence for depression or mood disorders  The patient's weight, height, BMI, and visual acuity have been recorded in the chart.  I have made referrals, counseling, and provided education to the patient based on review of the above and I have provided the patient with a written personalized care plan for preventive services.     Cassell Smiles, DNP, AGPCNP-BC Adult Gerontology Primary Care Nurse Practitioner [facility] 06/18/2018, 8:42 AM

## 2018-06-19 ENCOUNTER — Encounter: Payer: Self-pay | Admitting: Nurse Practitioner

## 2018-06-19 LAB — CYTOLOGY - PAP: Diagnosis: NEGATIVE

## 2018-06-20 ENCOUNTER — Telehealth: Payer: Self-pay

## 2018-06-20 DIAGNOSIS — R718 Other abnormality of red blood cells: Secondary | ICD-10-CM

## 2018-06-20 DIAGNOSIS — Z85038 Personal history of other malignant neoplasm of large intestine: Secondary | ICD-10-CM

## 2018-06-20 NOTE — Telephone Encounter (Signed)
Pt was notified of EGD results and to repeat colonoscopy in 5 years. Also informed that a referral will be made to hematology/oncology. Pt reassured. She also made an appt to come in and talk with doctor.

## 2018-06-20 NOTE — Telephone Encounter (Signed)
-----   Message from Virgel Manifold, MD sent at 06/18/2018  9:43 AM EDT ----- Joyce Pace please let patient know, her EGD biopsies were benign. Her colon polyp was benign but precancerous and was completely removed. Her repeat colonoscopy should be in 5 yrs.  Please refer her to Hematology/Oncology due to microcytosis on bloodwork and previous history of colon cancer. Tell her this is not worrisome but she should establish care with an oncologist due to her previous history

## 2018-06-23 LAB — TEST AUTHORIZATION

## 2018-06-23 LAB — COMPLETE METABOLIC PANEL WITH GFR
AG Ratio: 1.4 (calc) (ref 1.0–2.5)
ALT: 13 U/L (ref 6–29)
AST: 21 U/L (ref 10–35)
Albumin: 3.8 g/dL (ref 3.6–5.1)
Alkaline phosphatase (APISO): 87 U/L (ref 33–130)
BUN: 13 mg/dL (ref 7–25)
CO2: 29 mmol/L (ref 20–32)
Calcium: 9 mg/dL (ref 8.6–10.4)
Chloride: 106 mmol/L (ref 98–110)
Creat: 0.74 mg/dL (ref 0.50–0.99)
GFR, Est African American: 99 mL/min/{1.73_m2} (ref >=60)
GFR, Est Non African American: 85 mL/min/{1.73_m2} (ref >=60)
Globulin: 2.7 g/dL (calc) (ref 1.9–3.7)
Glucose, Bld: 93 mg/dL (ref 65–99)
Potassium: 4.6 mmol/L (ref 3.5–5.3)
Sodium: 142 mmol/L (ref 135–146)
Total Bilirubin: 1.2 mg/dL (ref 0.2–1.2)
Total Protein: 6.5 g/dL (ref 6.1–8.1)

## 2018-06-23 LAB — LIPID PANEL
Cholesterol: 153 mg/dL (ref <200)
HDL: 50 mg/dL — ABNORMAL LOW (ref >50)
LDL Cholesterol (Calc): 83 mg/dL (calc)
Non-HDL Cholesterol (Calc): 103 mg/dL (calc) (ref <130)
Total CHOL/HDL Ratio: 3.1 (calc) (ref <5.0)
Triglycerides: 100 mg/dL (ref <150)

## 2018-06-23 LAB — CBC WITH DIFFERENTIAL/PLATELET
Basophils Absolute: 19 cells/uL (ref 0–200)
Basophils Relative: 0.3 %
Eosinophils Absolute: 252 cells/uL (ref 15–500)
Eosinophils Relative: 4 %
HCT: 37.5 % (ref 35.0–45.0)
Hemoglobin: 12.1 g/dL (ref 11.7–15.5)
Lymphs Abs: 1292 cells/uL (ref 850–3900)
MCH: 24.4 pg — ABNORMAL LOW (ref 27.0–33.0)
MCHC: 32.3 g/dL (ref 32.0–36.0)
MCV: 75.8 fL — ABNORMAL LOW (ref 80.0–100.0)
MPV: 11.8 fL (ref 7.5–12.5)
Monocytes Relative: 6.7 %
Neutro Abs: 4316 cells/uL (ref 1500–7800)
Neutrophils Relative %: 68.5 %
Platelets: 168 10*3/uL (ref 140–400)
RBC: 4.95 10*6/uL (ref 3.80–5.10)
RDW: 13.4 % (ref 11.0–15.0)
Total Lymphocyte: 20.5 %
WBC mixed population: 422 cells/uL (ref 200–950)
WBC: 6.3 10*3/uL (ref 3.8–10.8)

## 2018-06-23 LAB — IRON,TIBC AND FERRITIN PANEL
%SAT: 20 % (calc) (ref 16–45)
Ferritin: 37 ng/mL (ref 16–288)
Iron: 58 ug/dL (ref 45–160)
TIBC: 292 mcg/dL (calc) (ref 250–450)

## 2018-06-23 LAB — TSH: TSH: 1.01 mIU/L (ref 0.40–4.50)

## 2018-06-23 LAB — HIV ANTIBODY (ROUTINE TESTING W REFLEX): HIV 1&2 Ab, 4th Generation: NONREACTIVE

## 2018-06-23 LAB — HEPATITIS C ANTIBODY
Hepatitis C Ab: NONREACTIVE
SIGNAL TO CUT-OFF: 0.02 (ref <1.00)

## 2018-06-25 ENCOUNTER — Encounter: Payer: Self-pay | Admitting: Nurse Practitioner

## 2018-06-25 ENCOUNTER — Telehealth: Payer: Self-pay | Admitting: Nurse Practitioner

## 2018-06-25 NOTE — Telephone Encounter (Signed)
Pt said her arm is red, hot and hurts where she got pneumonia shot last week [phone]

## 2018-06-25 NOTE — Telephone Encounter (Signed)
The pt called complaining of swelling, redness , warmth feeling on the injection site from the pneumonia vaccine. The redness is about the size of the palm of her hand. I recommended the patient to apply cold compress in the meantime until I call her back and give her your recommendations. The pt is aware that you are out of the office today and states she can come in tomorrow if no improvement.  Please advise

## 2018-06-25 NOTE — Telephone Encounter (Signed)
Attempted to contact the patient, left message on her vm to return my call.

## 2018-06-25 NOTE — Telephone Encounter (Signed)
The pt was notified no questions or concern.

## 2018-06-25 NOTE — Telephone Encounter (Signed)
-   START acetaminophen 500 mg tid  - Place ice over site up to 6 times daily for 15 minutes. - Call clinic Friday if no improvement or if any worsening between now and then.

## 2018-07-01 ENCOUNTER — Telehealth: Payer: Self-pay | Admitting: Nurse Practitioner

## 2018-07-01 NOTE — Telephone Encounter (Signed)
Pt called to check status of referral to cancer center [phone]

## 2018-07-23 ENCOUNTER — Encounter: Payer: Self-pay | Admitting: Gastroenterology

## 2018-07-23 ENCOUNTER — Ambulatory Visit (INDEPENDENT_AMBULATORY_CARE_PROVIDER_SITE_OTHER): Payer: Medicare Other | Admitting: Gastroenterology

## 2018-07-23 VITALS — BP 129/77 | HR 74 | Ht 62.0 in | Wt 149.0 lb

## 2018-07-23 DIAGNOSIS — R718 Other abnormality of red blood cells: Secondary | ICD-10-CM

## 2018-07-31 ENCOUNTER — Telehealth: Payer: Self-pay

## 2018-07-31 NOTE — Telephone Encounter (Signed)
Spoke with the patient to inform new patient appointment time and date 10/3 @ 11:00 AM. This will be the 3rd time rescheduling the patient for a new patient appointment. The patient was understanding and agreeable to come in for the appointme time and date.

## 2018-07-31 NOTE — Progress Notes (Signed)
Vonda Antigua, MD [address]  [place]  [place], [place] [address]  Main: [phone]  Fax: [phone]   Primary Care Physician: Mikey College, NP  Primary Gastroenterologist:  Dr. Vonda Antigua  Chief Complaint  Patient presents with  . Follow-up    HPI: Joyce Pace is a 67 y.o. female with history of colon cancer in 2014 here for follow-up.  Last colonoscopy and EGD on August 2019 EGD showed salmon-colored mucosa C0 M1 Barrett's, single gastric polyp, erythematous gastric antrum. Colonoscopy with 5 mm sigmoid colon, polyp removed.  Colonic anastomosis seen and appeared normal.  Nonbleeding internal hemorrhoids.  Biopsy showed mild reactive gastropathy, no H. pylori.  Stomach polyp showed oxyntic mucosa.  GE junction biopsy showed columnar mucosa with mild chronic inflammation, negative for goblet cells.  Colon polyps were tubular adenoma.  Repeat colonoscopy recommended in 5 years.  Her lab work did not show any iron deficiency, but CBC shows microcytosis and patient was referred to hematology.  Current Outpatient Medications  Medication Sig Dispense Refill  . hydrochlorothiazide (HYDRODIURIL) 12.5 MG tablet Take 1 tablet (12.5 mg total) by mouth daily. 90 tablet 3  . lisinopril (PRINIVIL,ZESTRIL) 20 MG tablet Take 1 tablet (20 mg total) by mouth daily. 90 tablet 1  . omeprazole (PRILOSEC) 20 MG capsule Take 1 capsule (20 mg total) by mouth daily. 90 capsule 1  . PARoxetine (PAXIL) 10 MG tablet Take 1 tablet (10 mg total) by mouth daily. 90 tablet 1  . simvastatin (ZOCOR) 40 MG tablet Take 1 tablet (40 mg total) by mouth at bedtime. 90 tablet 3   No current facility-administered medications for this visit.     Allergies as of 07/23/2018  . (No Known Allergies)    ROS:  General: Negative for anorexia, weight loss, fever, chills, fatigue, weakness. ENT: Negative for hoarseness, difficulty swallowing , nasal congestion. CV: Negative  for chest pain, angina, palpitations, dyspnea on exertion, peripheral edema.  Respiratory: Negative for dyspnea at rest, dyspnea on exertion, cough, sputum, wheezing.  GI: See history of present illness. GU:  Negative for dysuria, hematuria, urinary incontinence, urinary frequency, nocturnal urination.  Endo: Negative for unusual weight change.    Physical Examination:   BP 129/77   Pulse 74   Ht 5\' 2"  (1.575 m)   Wt 149 lb (67.6 kg)   BMI 27.25 kg/m   General: Well-nourished, well-developed in no acute distress.  Eyes: No icterus. Conjunctivae pink. Mouth: Oropharyngeal mucosa moist and pink , no lesions erythema or exudate. Neck: Supple, Trachea midline Abdomen: Bowel sounds are normal, nontender, nondistended, no hepatosplenomegaly or masses, no abdominal bruits or hernia , no rebound or guarding.   Extremities: No lower extremity edema. No clubbing or deformities. Neuro: Alert and oriented x 3.  Grossly intact. Skin: Warm and dry, no jaundice.   Psych: Alert and cooperative, normal mood and affect.   Labs: CMP     Component Value Date/Time   NA 142 06/19/2018 0911   K 4.6 06/19/2018 0911   CL 106 06/19/2018 0911   CO2 29 06/19/2018 0911   GLUCOSE 93 06/19/2018 0911   BUN 13 06/19/2018 0911   CREATININE 0.74 06/19/2018 0911   CALCIUM 9.0 06/19/2018 0911   PROT 6.5 06/19/2018 0911   AST 21 06/19/2018 0911   ALT 13 06/19/2018 0911   BILITOT 1.2 06/19/2018 0911   GFRNONAA 85 06/19/2018 0911   GFRAA 99 06/19/2018 0911   Lab Results  Component Value Date   WBC  6.3 06/19/2018   HGB 12.1 06/19/2018   HCT 37.5 06/19/2018   MCV 75.8 (L) 06/19/2018   PLT 168 06/19/2018    Imaging Studies: No results found.  Assessment and Plan:   Joyce Pace is a 67 y.o. y/o female with history of colon cancer status post resection and chemotherapy in 2014 here for follow-up  EGD and colonoscopy complete as stated in HPI Patient remains asymptomatic Next colonoscopy  indicated in 5 years from 2019  Patient has been referred to hematology due to microcytosis, without iron deficiency Patient has an appointment with him on October 3 and was encouraged to follow-up with them  Dr Vonda Antigua

## 2018-08-07 ENCOUNTER — Encounter: Payer: Self-pay | Admitting: Oncology

## 2018-08-07 ENCOUNTER — Other Ambulatory Visit: Payer: Self-pay

## 2018-08-07 ENCOUNTER — Inpatient Hospital Stay: Payer: Medicare Other

## 2018-08-07 ENCOUNTER — Inpatient Hospital Stay: Payer: Medicare Other | Attending: Oncology | Admitting: Oncology

## 2018-08-07 VITALS — BP 116/73 | HR 76 | Temp 98.2°F | Resp 18 | Ht 62.0 in | Wt 150.2 lb

## 2018-08-07 DIAGNOSIS — D509 Iron deficiency anemia, unspecified: Secondary | ICD-10-CM

## 2018-08-07 DIAGNOSIS — Z79899 Other long term (current) drug therapy: Secondary | ICD-10-CM | POA: Diagnosis not present

## 2018-08-07 DIAGNOSIS — Z9221 Personal history of antineoplastic chemotherapy: Secondary | ICD-10-CM | POA: Insufficient documentation

## 2018-08-07 DIAGNOSIS — Z85038 Personal history of other malignant neoplasm of large intestine: Secondary | ICD-10-CM | POA: Insufficient documentation

## 2018-08-07 LAB — CBC WITH DIFFERENTIAL/PLATELET
BASOS ABS: 0.1 10*3/uL (ref 0–0.1)
Basophils Relative: 2 %
EOS ABS: 0.2 10*3/uL (ref 0–0.7)
EOS PCT: 4 %
HCT: 37.8 % (ref 35.0–47.0)
Hemoglobin: 12.3 g/dL (ref 12.0–16.0)
LYMPHS ABS: 1.7 10*3/uL (ref 1.0–3.6)
LYMPHS PCT: 37 %
MCH: 24.8 pg — AB (ref 26.0–34.0)
MCHC: 32.6 g/dL (ref 32.0–36.0)
MCV: 75.9 fL — ABNORMAL LOW (ref 80.0–100.0)
MONO ABS: 0.3 10*3/uL (ref 0.2–0.9)
Monocytes Relative: 7 %
Neutro Abs: 2.2 10*3/uL (ref 1.4–6.5)
Neutrophils Relative %: 49 %
PLATELETS: 214 10*3/uL (ref 150–440)
RBC: 4.97 MIL/uL (ref 3.80–5.20)
RDW: 14.1 % (ref 11.5–14.5)
WBC: 4.5 10*3/uL (ref 3.6–11.0)

## 2018-08-07 LAB — COMPREHENSIVE METABOLIC PANEL
ALT: 16 U/L (ref 0–44)
AST: 21 U/L (ref 15–41)
Albumin: 4.1 g/dL (ref 3.5–5.0)
Alkaline Phosphatase: 80 U/L (ref 38–126)
Anion gap: 6 (ref 5–15)
BUN: 13 mg/dL (ref 8–23)
CHLORIDE: 105 mmol/L (ref 98–111)
CO2: 30 mmol/L (ref 22–32)
Calcium: 9.4 mg/dL (ref 8.9–10.3)
Creatinine, Ser: 0.75 mg/dL (ref 0.44–1.00)
GFR calc non Af Amer: >60 mL/min (ref >60)
Glucose, Bld: 94 mg/dL (ref 70–99)
POTASSIUM: 4.4 mmol/L (ref 3.5–5.1)
Sodium: 141 mmol/L (ref 135–145)
Total Bilirubin: 1.2 mg/dL (ref 0.3–1.2)
Total Protein: 7.4 g/dL (ref 6.5–8.1)

## 2018-08-07 LAB — IRON AND TIBC
IRON: 73 ug/dL (ref 28–170)
Saturation Ratios: 23 % (ref 10.4–31.8)
TIBC: 321 ug/dL (ref 250–450)
UIBC: 248 ug/dL

## 2018-08-07 LAB — FERRITIN: FERRITIN: 66 ng/mL (ref 11–307)

## 2018-08-07 NOTE — Progress Notes (Signed)
Met with Joyce Pace prior to consult with Dr. Janese Banks. Dx with colon cancer in 2014. She is here today for microcytosis. Introduced Therapist, nutritional and provided contact information for future needs. Oncology Nurse Navigator Documentation  Navigator Location: [facility] Onc (08/07/18 1400)   )Navigator Encounter Type: Initial MedOnc (08/07/18 1400)                     Patient Visit Type: MedOnc;Initial (08/07/18 1400)                              Time Spent with Patient: 15 (08/07/18 1400)

## 2018-08-08 LAB — HEMOGLOBINOPATHY EVALUATION
HGB A2 QUANT: 2.3 % (ref 1.8–3.2)
Hgb A: 97.7 % (ref 96.4–98.8)
Hgb C: 0 %
Hgb F Quant: 0 % (ref 0.0–2.0)
Hgb S Quant: 0 %
Hgb Variant: 0 %

## 2018-08-08 LAB — CEA: CEA1: 2 ng/mL (ref 0.0–4.7)

## 2018-08-08 LAB — VITAMIN B12: Vitamin B-12: 239 pg/mL (ref 180–914)

## 2018-08-08 NOTE — Progress Notes (Signed)
Hematology/Oncology Consult note [facility] Telephone:([phone] [phone]  Patient Care Team: Mikey College, NP as PCP - General (Nurse Practitioner) Virgel Manifold, MD as Consulting Physician (Gastroenterology)   Name of the patient: Joyce Pace  [id number]  1951/04/10    Reason for referral- 1. H/o colon cancer in 2014 2. Microcytic anemia   Referring physician- Dr. Merrilyn Puma  Date of visit: 08/08/18   History of presenting illness- Patient is a 67 year old female with a prior history of colon cancer that was diagnosed in 2014 in [place].This was stage IIa PT 3 PN 0 grade 2.  Final pathology showed moderately differentiated adenocarcinoma extensively ulcerated with extension into muscularis propria and pericolonic adipose tissue.  Maximum size of the tumor was 5 cm with significant luminal obstruction.  Margins were -15 lymph nodes were negative for malignancy.  Patient underwent 12 cycles of adjuvant FOLFOX in September 2014.  She also subsequently had surveillance colonoscopy done and most recently was seen by Dr. Bonna Gains and underwent EGD and colonoscopy which was unremarkable.  Colonoscopy in August 2019 showed a 5 mm sigmoid polyp which was negative for malignancy.  Last CT scan was in September 2017 which showed no evidence of malignancy.  Most recent CBC from 06/19/2018 showed white count of 6.3, H&H of 12.1/37.5 with an MCV of 75.8 and a platelet count of 168.  Overall patient reports mild fatigue but is independent of her ADLs and IADLs.  Her appetite is stable and she denies any unintentional weight loss.  She denies any blood in stool or urine.  She is currently not taking any iron supplements.  ECOG PS- 0  Pain scale- 0   Review of systems- ROS  No Known Allergies  Patient Active Problem List   Diagnosis Date Noted  . Social isolation 06/18/2018  . Epigastric pain 06/18/2018  . Polyp of sigmoid colon   .  Internal hemorrhoids   . S/P partial colectomy   . Barrett's esophagus without dysplasia   . Gastric polyp   . Stomach irritation   . Personal history of digestive disease   . Personal history of malignant neoplasm of digestive organs   . Essential hypertension 02/19/2018  . Hyperlipidemia 02/19/2018  . History of colon cancer 02/19/2018  . Reactive depression 02/19/2018     Past Medical History:  Diagnosis Date  . Anxiety   . Colon cancer ([place]) 2014  . Hypertension      Past Surgical History:  Procedure Laterality Date  . CHOLECYSTECTOMY    . COLON SURGERY  2016   colon resection  . COLONOSCOPY WITH PROPOFOL N/A 06/12/2018   Procedure: COLONOSCOPY WITH PROPOFOL;  Surgeon: Virgel Manifold, MD;  Location: [facility] ENDOSCOPY;  Service: Endoscopy;  Laterality: N/A;  . ESOPHAGOGASTRODUODENOSCOPY (EGD) WITH PROPOFOL N/A 06/12/2018   Procedure: ESOPHAGOGASTRODUODENOSCOPY (EGD) WITH PROPOFOL;  Surgeon: Virgel Manifold, MD;  Location: [facility] ENDOSCOPY;  Service: Endoscopy;  Laterality: N/A;    Social History   Socioeconomic History  . Marital status: Married    Spouse name: Not on file  . Number of children: Not on file  . Years of education: Not on file  . Highest education level: High school graduate  Occupational History  . Not on file  Social Needs  . Financial resource strain: Not very hard  . Food insecurity:    Worry: Never true    Inability: Never true  . Transportation needs:    Medical: No    Non-medical: No  Tobacco Use  . Smoking status: Never Smoker  . Smokeless tobacco: Never Used  Substance and Sexual Activity  . Alcohol use: No  . Drug use: No  . Sexual activity: Not Currently  Lifestyle  . Physical activity:    Days per week: 2 days    Minutes per session: 30 min  . Stress: To some extent  Relationships  . Social connections:    Talks on phone: Three times a week    Gets together: More than three times a week    Attends religious service:  More than 4 times per year    Active member of club or organization: Yes    Attends meetings of clubs or organizations: More than 4 times per year    Relationship status: Married  . Intimate partner violence:    Fear of current or ex partner: No    Emotionally abused: No    Physically abused: No    Forced sexual activity: No  Other Topics Concern  . Not on file  Social History Narrative  . Not on file     Family History  Problem Relation Age of Onset  . Hyperlipidemia Mother   . Hypertension Mother   . Diabetes Father   . Hypertension Sister   . Hyperlipidemia Sister   . Hyperlipidemia Brother   . Hypertension Brother   . Ovarian cancer Daughter   . Healthy Son   . Heart disease Maternal Grandmother   . Heart disease Maternal Grandfather   . Heart disease Paternal Grandmother   . Heart disease Paternal Grandfather   . Hyperlipidemia Sister   . Healthy Daughter      Current Outpatient Medications:  .  hydrochlorothiazide (HYDRODIURIL) 12.5 MG tablet, Take 1 tablet (12.5 mg total) by mouth daily., Disp: 90 tablet, Rfl: 3 .  lisinopril (PRINIVIL,ZESTRIL) 20 MG tablet, Take 1 tablet (20 mg total) by mouth daily., Disp: 90 tablet, Rfl: 1 .  PARoxetine (PAXIL) 10 MG tablet, Take 1 tablet (10 mg total) by mouth daily., Disp: 90 tablet, Rfl: 1 .  simvastatin (ZOCOR) 40 MG tablet, Take 1 tablet (40 mg total) by mouth at bedtime., Disp: 90 tablet, Rfl: 3 .  omeprazole (PRILOSEC) 20 MG capsule, Take 1 capsule (20 mg total) by mouth daily. (Patient not taking: Reported on 08/07/2018), Disp: 90 capsule, Rfl: 1   Physical exam:  Vitals:   08/07/18 1124  BP: 116/73  Pulse: 76  Resp: 18  Temp: 98.2 F (36.8 C)  TempSrc: Tympanic  SpO2: 96%  Weight: 150 lb 3.2 oz (68.1 kg)  Height: 5\' 2"  (1.575 m)   Physical Exam     CMP Latest Ref Rng & Units 08/07/2018  Glucose 70 - 99 mg/dL 94  BUN 8 - 23 mg/dL 13  Creatinine 0.44 - 1.00 mg/dL 0.75  Sodium 135 - 145 mmol/L 141    Potassium 3.5 - 5.1 mmol/L 4.4  Chloride 98 - 111 mmol/L 105  CO2 22 - 32 mmol/L 30  Calcium 8.9 - 10.3 mg/dL 9.4  Total Protein 6.5 - 8.1 g/dL 7.4  Total Bilirubin 0.3 - 1.2 mg/dL 1.2  Alkaline Phos 38 - 126 U/L 80  AST 15 - 41 U/L 21  ALT 0 - 44 U/L 16   CBC Latest Ref Rng & Units 08/07/2018  WBC 3.6 - 11.0 K/uL 4.5  Hemoglobin 12.0 - 16.0 g/dL 12.3  Hematocrit 35.0 - 47.0 % 37.8  Platelets 150 - 440 K/uL 214     Assessment  and plan- Patient is a 67 y.o. female referred for following issues:  1.  History of stage II colon cancer in 2014: Patient is completed 12 cycles of adjuvant FOLFOX chemotherapy in the past.  Recent colonoscopy from August 2019 showed no evidence of malignancy.  Last CT scan in 2017 was also normal.  This would be her fifth year of surveillance and I will obtain CT chest and abdomen and pelvis for one last time to mark 5 years of her surveillance after which she would not need any routine surveillance scans or CEAs.  I will check a CEA today.  She will follow-up with GI for surveillance colonoscopy 5 years from now  2.  Microcytosis with mild anemia: Based on her labs in August 2019 her iron studies were normal and ferritin was low normal at 37.  I will repeat her CBC with ferritin and iron studies as well as B12 and folate today.  I will also check hemoglobinopathy testing today to rule out thalassemia or sickle cell as a cause of microcytosis  I will see the patient back in 2 to 3 weeks time to discuss the results of the CT scan and her anemia work-up  Thank you for this kind referral and the opportunity to participate in the care of this patient   Visit Diagnosis 1. Microcytic anemia   2. History of colon cancer     Dr. Randa Evens, MD, MPH [facility] at [facility] [phone] 08/08/2018  11:03 AM

## 2018-08-19 ENCOUNTER — Ambulatory Visit: Payer: Medicare Other | Attending: Oncology

## 2018-08-22 ENCOUNTER — Telehealth: Payer: Self-pay | Admitting: Oncology

## 2018-08-22 NOTE — Telephone Encounter (Signed)
Called patient to rschd NO SHOW CT appts and f/u. L/M on v/m for patient to call for appts, per patient out of town.

## 2018-08-26 ENCOUNTER — Inpatient Hospital Stay: Payer: Medicare Other | Admitting: Oncology

## 2018-09-25 ENCOUNTER — Telehealth: Payer: Self-pay | Admitting: Nurse Practitioner

## 2018-09-25 NOTE — Telephone Encounter (Signed)
I gave the pt Radiology phone number to contact and find out how to do the  CT prep. She verbalize understanding, no questions or concerns.

## 2018-09-25 NOTE — Telephone Encounter (Signed)
Pt has questions about medication and prep for CT scheduled for tomorrow [phone]

## 2018-09-26 ENCOUNTER — Ambulatory Visit
Admission: RE | Admit: 2018-09-26 | Discharge: 2018-09-26 | Disposition: A | Discharge status: Home / Self Care | Payer: Medicare Other | Source: Ambulatory Visit | Attending: Oncology | Admitting: Oncology

## 2018-09-26 DIAGNOSIS — Z08 Encounter for follow-up examination after completed treatment for malignant neoplasm: Secondary | ICD-10-CM | POA: Insufficient documentation

## 2018-09-26 DIAGNOSIS — R918 Other nonspecific abnormal finding of lung field: Secondary | ICD-10-CM | POA: Insufficient documentation

## 2018-09-26 DIAGNOSIS — D509 Iron deficiency anemia, unspecified: Secondary | ICD-10-CM | POA: Diagnosis present

## 2018-09-26 DIAGNOSIS — I700 Atherosclerosis of aorta: Secondary | ICD-10-CM | POA: Insufficient documentation

## 2018-09-26 DIAGNOSIS — Z85038 Personal history of other malignant neoplasm of large intestine: Secondary | ICD-10-CM | POA: Insufficient documentation

## 2018-09-26 LAB — POCT I-STAT CREATININE: Creatinine, Ser: 0.7 mg/dL (ref 0.44–1.00)

## 2018-09-26 MED ORDER — IOPAMIDOL (ISOVUE-300) INJECTION 61%
100.0000 mL | Freq: Once | INTRAVENOUS | Status: AC | PRN
  Administered 2018-09-26: 100 mL via INTRAVENOUS

## 2018-10-01 ENCOUNTER — Other Ambulatory Visit: Payer: Self-pay

## 2018-10-01 NOTE — Patient Outreach (Signed)
[facility]) Care Management  10/01/2018  Joyce Pace 1951/01/06 [id number]   Medication Adherence call to Joyce Pace left a message for patient to call back patient is showing past due on Lisinopril 20 mg. Joyce Pace is showing past due under Joyce Pace.    [facility] Direct Dial [phone]  Fax [phone] [email or web address] [email or web address]

## 2018-10-09 ENCOUNTER — Ambulatory Visit: Payer: Medicare Other | Admitting: Oncology

## 2018-10-13 ENCOUNTER — Other Ambulatory Visit: Payer: Self-pay | Admitting: *Deleted

## 2018-10-13 ENCOUNTER — Inpatient Hospital Stay: Payer: Medicare Other | Attending: Oncology | Admitting: Oncology

## 2018-10-13 ENCOUNTER — Encounter: Payer: Self-pay | Admitting: Oncology

## 2018-10-13 VITALS — BP 146/81 | HR 69 | Temp 97.1°F | Resp 16 | Ht 62.0 in | Wt 152.8 lb

## 2018-10-13 DIAGNOSIS — Z79899 Other long term (current) drug therapy: Secondary | ICD-10-CM | POA: Diagnosis not present

## 2018-10-13 DIAGNOSIS — D509 Iron deficiency anemia, unspecified: Secondary | ICD-10-CM

## 2018-10-13 DIAGNOSIS — Z85038 Personal history of other malignant neoplasm of large intestine: Secondary | ICD-10-CM | POA: Diagnosis not present

## 2018-10-13 DIAGNOSIS — I10 Essential (primary) hypertension: Secondary | ICD-10-CM | POA: Diagnosis not present

## 2018-10-13 DIAGNOSIS — R918 Other nonspecific abnormal finding of lung field: Secondary | ICD-10-CM | POA: Diagnosis not present

## 2018-10-13 NOTE — Progress Notes (Signed)
Hematology/Oncology Consult note [facility]  Telephone:([phone] [phone]  Patient Care Team: Mikey College, NP as PCP - General (Nurse Practitioner) Virgel Manifold, MD as Consulting Physician (Gastroenterology)   Name of the patient: Joyce Pace  [id number]  1951-04-02   Date of visit: 10/13/18  Diagnosis- 1. H/o colon cancer in 2014 2. Microcytic anemia  Chief complaint/ Reason for visit-discuss results of CT scan  Heme/Onc history: Patient is a 67 year old female with a prior history of colon cancer that was diagnosed in 49 in [place].This was stage IIa PT 3 PN 0 grade 2.  Final pathology showed moderately differentiated adenocarcinoma extensively ulcerated with extension into muscularis propria and pericolonic adipose tissue.  Maximum size of the tumor was 5 cm with significant luminal obstruction.  Margins were -15 lymph nodes were negative for malignancy.  Patient underwent 12 cycles of adjuvant FOLFOX in September 2014.  She also subsequently had surveillance colonoscopy done and most recently was seen by Dr. Bonna Gains and underwent EGD and colonoscopy which was unremarkable.  Colonoscopy in August 2019 showed a 5 mm sigmoid polyp which was negative for malignancy.  Last CT scan was in September 2017 which showed no evidence of malignancy.  Most recent CBC from 06/19/2018 showed white count of 6.3, H&H of 12.1/37.5 with an MCV of 75.8 and a platelet count of 168.  Interval history-overall she is doing well.  Her appetite is good and she denies any unintentional weight loss.  Denies any nausea vomiting or abdominal pain.  Denies any blood in her stool or urine  ECOG PS- 0 Pain scale- 0 Opioid associated constipation- no  Review of systems- Review of Systems  Constitutional: Negative for chills, fever, malaise/fatigue and weight loss.  HENT: Negative for congestion, ear discharge and nosebleeds.   Eyes: Negative  for blurred vision.  Respiratory: Negative for cough, hemoptysis, sputum production, shortness of breath and wheezing.   Cardiovascular: Negative for chest pain, palpitations, orthopnea and claudication.  Gastrointestinal: Negative for abdominal pain, blood in stool, constipation, diarrhea, heartburn, melena, nausea and vomiting.  Genitourinary: Negative for dysuria, flank pain, frequency, hematuria and urgency.  Musculoskeletal: Negative for back pain, joint pain and myalgias.  Skin: Negative for rash.  Neurological: Negative for dizziness, tingling, focal weakness, seizures, weakness and headaches.  Endo/Heme/Allergies: Does not bruise/bleed easily.  Psychiatric/Behavioral: Negative for depression and suicidal ideas. The patient does not have insomnia.        No Known Allergies   Past Medical History:  Diagnosis Date  . Anxiety   . Colon cancer ([place]) 2014  . Hypertension      Past Surgical History:  Procedure Laterality Date  . CHOLECYSTECTOMY    . COLON SURGERY  2016   colon resection  . COLONOSCOPY WITH PROPOFOL N/A 06/12/2018   Procedure: COLONOSCOPY WITH PROPOFOL;  Surgeon: Virgel Manifold, MD;  Location: [facility] ENDOSCOPY;  Service: Endoscopy;  Laterality: N/A;  . ESOPHAGOGASTRODUODENOSCOPY (EGD) WITH PROPOFOL N/A 06/12/2018   Procedure: ESOPHAGOGASTRODUODENOSCOPY (EGD) WITH PROPOFOL;  Surgeon: Virgel Manifold, MD;  Location: [facility] ENDOSCOPY;  Service: Endoscopy;  Laterality: N/A;    Social History   Socioeconomic History  . Marital status: Married    Spouse name: Not on file  . Number of children: Not on file  . Years of education: Not on file  . Highest education level: High school graduate  Occupational History  . Not on file  Social Needs  . Financial resource strain: Not very hard  .  Food insecurity:    Worry: Never true    Inability: Never true  . Transportation needs:    Medical: No    Non-medical: No  Tobacco Use  . Smoking status: Never Smoker    . Smokeless tobacco: Never Used  Substance and Sexual Activity  . Alcohol use: No  . Drug use: No  . Sexual activity: Not Currently  Lifestyle  . Physical activity:    Days per week: 2 days    Minutes per session: 30 min  . Stress: To some extent  Relationships  . Social connections:    Talks on phone: Three times a week    Gets together: More than three times a week    Attends religious service: More than 4 times per year    Active member of club or organization: Yes    Attends meetings of clubs or organizations: More than 4 times per year    Relationship status: Married  . Intimate partner violence:    Fear of current or ex partner: No    Emotionally abused: No    Physically abused: No    Forced sexual activity: No  Other Topics Concern  . Not on file  Social History Narrative  . Not on file    Family History  Problem Relation Age of Onset  . Hyperlipidemia Mother   . Hypertension Mother   . Diabetes Father   . Hypertension Sister   . Hyperlipidemia Sister   . Hyperlipidemia Brother   . Hypertension Brother   . Ovarian cancer Daughter   . Healthy Son   . Heart disease Maternal Grandmother   . Heart disease Maternal Grandfather   . Heart disease Paternal Grandmother   . Heart disease Paternal Grandfather   . Hyperlipidemia Sister   . Healthy Daughter      Current Outpatient Medications:  .  hydrochlorothiazide (HYDRODIURIL) 12.5 MG tablet, Take 1 tablet (12.5 mg total) by mouth daily., Disp: 90 tablet, Rfl: 3 .  lisinopril (PRINIVIL,ZESTRIL) 20 MG tablet, Take 1 tablet (20 mg total) by mouth daily., Disp: 90 tablet, Rfl: 1 .  PARoxetine (PAXIL) 10 MG tablet, Take 1 tablet (10 mg total) by mouth daily., Disp: 90 tablet, Rfl: 1 .  simvastatin (ZOCOR) 40 MG tablet, Take 1 tablet (40 mg total) by mouth at bedtime., Disp: 90 tablet, Rfl: 3 .  omeprazole (PRILOSEC) 20 MG capsule, Take 1 capsule (20 mg total) by mouth daily. (Patient not taking: Reported on  08/07/2018), Disp: 90 capsule, Rfl: 1  Physical exam:  Vitals:   10/13/18 1036  BP: (!) 146/81  Pulse: 69  Resp: 16  Temp: (!) 97.1 F (36.2 C)  TempSrc: Tympanic  Weight: 152 lb 12.5 oz (69.3 kg)  Height: 5\' 2"  (1.575 m)   Physical Exam  Constitutional: She is oriented to person, place, and time. She appears well-developed and well-nourished.  HENT:  Head: Normocephalic and atraumatic.  Eyes: Pupils are equal, round, and reactive to light. EOM are normal.  Neck: Normal range of motion.  Cardiovascular: Normal rate, regular rhythm and normal heart sounds.  Pulmonary/Chest: Effort normal and breath sounds normal.  Abdominal: Soft. Bowel sounds are normal.  Neurological: She is alert and oriented to person, place, and time.  Skin: Skin is warm and dry.     CMP Latest Ref Rng & Units 09/26/2018  Glucose 70 - 99 mg/dL -  BUN 8 - 23 mg/dL -  Creatinine 0.44 - 1.00 mg/dL 0.70  Sodium 135 -  145 mmol/L -  Potassium 3.5 - 5.1 mmol/L -  Chloride 98 - 111 mmol/L -  CO2 22 - 32 mmol/L -  Calcium 8.9 - 10.3 mg/dL -  Total Protein 6.5 - 8.1 g/dL -  Total Bilirubin 0.3 - 1.2 mg/dL -  Alkaline Phos 38 - 126 U/L -  AST 15 - 41 U/L -  ALT 0 - 44 U/L -   CBC Latest Ref Rng & Units 08/07/2018  WBC 3.6 - 11.0 K/uL 4.5  Hemoglobin 12.0 - 16.0 g/dL 12.3  Hematocrit 35.0 - 47.0 % 37.8  Platelets 150 - 440 K/uL 214    No images are attached to the encounter.  Ct Chest W Contrast  Result Date: 09/26/2018 CLINICAL DATA:  Followup colon cancer EXAM: CT CHEST, ABDOMEN, AND PELVIS WITH CONTRAST TECHNIQUE: Multidetector CT imaging of the chest, abdomen and pelvis was performed following the standard protocol during bolus administration of intravenous contrast. CONTRAST:  180mL ISOVUE-300 IOPAMIDOL (ISOVUE-300) INJECTION 61% COMPARISON:  None. FINDINGS: CT CHEST FINDINGS Cardiovascular: No significant vascular findings. Normal heart size. No pericardial effusion. Aortic atherosclerosis.  Mediastinum/Nodes: No enlarged mediastinal, hilar, or axillary lymph nodes. Thyroid gland, trachea, and esophagus demonstrate no significant findings. Lungs/Pleura: No pleural effusion or pneumothorax. Tiny subpleural nodule in the right middle lobe anteriorly measures 3 mm, image 86/3. Nonspecific. Within the posteromedial left lung apex there is a 3 mm lung nodule, image 28/3. Also nonspecific. Musculoskeletal: No chest wall mass or suspicious bone lesions identified. CT ABDOMEN PELVIS FINDINGS Hepatobiliary: No focal liver abnormality is seen. Status post cholecystectomy. No biliary dilatation. Pancreas: Unremarkable. No pancreatic ductal dilatation or surrounding inflammatory changes. Spleen: Normal in size without focal abnormality. Adrenals/Urinary Tract: Normal adrenal glands. No kidney stones identified. No mass or hydronephrosis. The urinary bladder appears normal. Stomach/Bowel: Stomach is within normal limits. Appendix appears normal. No evidence of bowel wall thickening, distention, or inflammatory changes. Surgical resection site involving the rectosigmoid colon is identified. No abnormal increased soft tissue to suggest local tumor recurrence. Vascular/Lymphatic: Normal appearance of the abdominal aorta. No enlarged retroperitoneal or mesenteric adenopathy. No enlarged pelvic or inguinal lymph nodes. Reproductive: Uterus and bilateral adnexa are unremarkable. Other: No ascites or focal fluid collections identified Musculoskeletal: Well-circumscribed, nonaggressive appearing lucent lesion within the L1 vertebra is noted measuring 7 mm, image 107/3. This is favored to represent a benign abnormality such as a vertebral hemangioma. No aggressive lytic or sclerotic bone lesions identified. IMPRESSION: 1. No specific findings to suggest recurrence of tumor or metastatic disease. 2. There are 2 tiny nodules identified within the right lung measuring less than 5 mm. Nonspecific. No follow-up needed if patient  is low-risk (and has no known or suspected primary neoplasm). Non-contrast chest CT can be considered in 12 months if patient is high-risk. This recommendation follows the consensus statement: Guidelines for Management of Incidental Pulmonary Nodules Detected on CT Images: From the Fleischner Society 2017; Radiology 2017; 284:228-243. 3.  Aortic Atherosclerosis (ICD10-I70.0). Electronically Signed   By: Kerby Moors M.D.   On: 09/26/2018 15:42   Ct Abdomen Pelvis W Contrast  Result Date: 09/26/2018 CLINICAL DATA:  Followup colon cancer EXAM: CT CHEST, ABDOMEN, AND PELVIS WITH CONTRAST TECHNIQUE: Multidetector CT imaging of the chest, abdomen and pelvis was performed following the standard protocol during bolus administration of intravenous contrast. CONTRAST:  152mL ISOVUE-300 IOPAMIDOL (ISOVUE-300) INJECTION 61% COMPARISON:  None. FINDINGS: CT CHEST FINDINGS Cardiovascular: No significant vascular findings. Normal heart size. No pericardial effusion. Aortic atherosclerosis. Mediastinum/Nodes: No enlarged  mediastinal, hilar, or axillary lymph nodes. Thyroid gland, trachea, and esophagus demonstrate no significant findings. Lungs/Pleura: No pleural effusion or pneumothorax. Tiny subpleural nodule in the right middle lobe anteriorly measures 3 mm, image 86/3. Nonspecific. Within the posteromedial left lung apex there is a 3 mm lung nodule, image 28/3. Also nonspecific. Musculoskeletal: No chest wall mass or suspicious bone lesions identified. CT ABDOMEN PELVIS FINDINGS Hepatobiliary: No focal liver abnormality is seen. Status post cholecystectomy. No biliary dilatation. Pancreas: Unremarkable. No pancreatic ductal dilatation or surrounding inflammatory changes. Spleen: Normal in size without focal abnormality. Adrenals/Urinary Tract: Normal adrenal glands. No kidney stones identified. No mass or hydronephrosis. The urinary bladder appears normal. Stomach/Bowel: Stomach is within normal limits. Appendix appears  normal. No evidence of bowel wall thickening, distention, or inflammatory changes. Surgical resection site involving the rectosigmoid colon is identified. No abnormal increased soft tissue to suggest local tumor recurrence. Vascular/Lymphatic: Normal appearance of the abdominal aorta. No enlarged retroperitoneal or mesenteric adenopathy. No enlarged pelvic or inguinal lymph nodes. Reproductive: Uterus and bilateral adnexa are unremarkable. Other: No ascites or focal fluid collections identified Musculoskeletal: Well-circumscribed, nonaggressive appearing lucent lesion within the L1 vertebra is noted measuring 7 mm, image 107/3. This is favored to represent a benign abnormality such as a vertebral hemangioma. No aggressive lytic or sclerotic bone lesions identified. IMPRESSION: 1. No specific findings to suggest recurrence of tumor or metastatic disease. 2. There are 2 tiny nodules identified within the right lung measuring less than 5 mm. Nonspecific. No follow-up needed if patient is low-risk (and has no known or suspected primary neoplasm). Non-contrast chest CT can be considered in 12 months if patient is high-risk. This recommendation follows the consensus statement: Guidelines for Management of Incidental Pulmonary Nodules Detected on CT Images: From the Fleischner Society 2017; Radiology 2017; 284:228-243. 3.  Aortic Atherosclerosis (ICD10-I70.0). Electronically Signed   By: Kerby Moors M.D.   On: 09/26/2018 15:42     Assessment and plan- Patient is a 67 y.o. female with history of colon cancer in 2014.  She is here for discussing the results of her CT scan  1.  I have reviewed the CT chest abdomen pelvis images independently and discussed findings with the patient.  Overall there is no evidence of metastatic disease.  She was found to have 2 subcentimeter lung nodules and he had no prior scans for comparison at this time.  She is a lifetime non-smoker.  Given her history of colon cancer I would  repeat a CT chest without contrast in 1 years time per radiology recommendations  2.  From a colon cancer standpoint she is now 5 years since her diagnosis with no evidence of active malignancy presently.  She does not require any surveillance scans or CEAs from here on per NCCN guidelines  3.  Patient noted to have microcytosis without overt anemia.  Iron studies are normal and hemoglobinopathy evaluation was also normal.  Continue to monitor.  She was also noted to have borderline low B12 levels of 230.  I have asked her to start taking oral B12 thousand micrograms p.o. daily and we will repeat her B12 levels as well in 6 months time  I will see her back in 6 months time with a repeat CBC with differential and iron studies.   Visit Diagnosis 1. History of colon cancer   2. Microcytic anemia   3. Abnormal CT scan of lung      Dr. Randa Evens, MD, MPH [place] at [facility] [phone] 10/13/2018 4:37 PM

## 2018-12-01 ENCOUNTER — Ambulatory Visit (INDEPENDENT_AMBULATORY_CARE_PROVIDER_SITE_OTHER): Payer: Medicare Other | Admitting: Nurse Practitioner

## 2018-12-01 ENCOUNTER — Encounter: Payer: Self-pay | Admitting: Nurse Practitioner

## 2018-12-01 VITALS — BP 145/63 | HR 78 | Resp 16 | Ht 62.0 in | Wt 152.4 lb

## 2018-12-01 DIAGNOSIS — I10 Essential (primary) hypertension: Secondary | ICD-10-CM | POA: Diagnosis not present

## 2018-12-01 DIAGNOSIS — F419 Anxiety disorder, unspecified: Secondary | ICD-10-CM | POA: Diagnosis not present

## 2018-12-01 DIAGNOSIS — E782 Mixed hyperlipidemia: Secondary | ICD-10-CM

## 2018-12-01 DIAGNOSIS — E538 Deficiency of other specified B group vitamins: Secondary | ICD-10-CM

## 2018-12-01 DIAGNOSIS — F32A Depression, unspecified: Secondary | ICD-10-CM

## 2018-12-01 DIAGNOSIS — F329 Major depressive disorder, single episode, unspecified: Secondary | ICD-10-CM

## 2018-12-01 MED ORDER — LISINOPRIL 20 MG PO TABS: 20.0000 mg | ORAL_TABLET | Freq: Every day | ORAL | 1 refills | Status: DC

## 2018-12-01 MED ORDER — PAROXETINE HCL 10 MG PO TABS: 10.0000 mg | ORAL_TABLET | Freq: Every day | ORAL | 1 refills | Status: DC

## 2018-12-01 MED ORDER — SIMVASTATIN 40 MG PO TABS: 40.0000 mg | ORAL_TABLET | Freq: Every day | ORAL | 3 refills | Status: DC

## 2018-12-01 MED ORDER — HYDROCHLOROTHIAZIDE 25 MG PO TABS: 25.0000 mg | ORAL_TABLET | Freq: Every day | ORAL | 1 refills | Status: DC

## 2018-12-01 NOTE — Progress Notes (Signed)
Subjective:    Patient ID: Joyce Pace, female    DOB: 01-08-1951, 68 y.o.   MRN: [id number]  Joyce Pace is a 68 y.o. female presenting on 12/01/2018 for Hypertension (states bp has been going up and down all over the place. No symptoms. )   HPI Hypertension - She is checking BP at home or outside of clinic.  Readings only when having an anxiety attack.  High 160s - Current medications: lisinopril 20 mg once daily, hctz 12.5 mg once daily, tolerating well without side effects - She is not currently symptomatic. - Pt denies headache, lightheadedness, dizziness, changes in vision, chest tightness/pressure, palpitations, leg swelling, sudden loss of speech or loss of consciousness. - She  reports no regular exercise routine.  Stays active indoors with housework, activities. - Her diet is moderate in salt, moderate in fat, and moderate in carbohydrates.   Depression Does have anxiety attacks some on paroxetine 10 mg once daily, but still has overwhelming anxiousness.  Has restlessness.  Is working on establishing a part-time work or Psychologist, occupational position to "get out of the house." - Doesn't have a house and this bothers her because she had an independent residence for 37 years.  She is living in a physical structure now with her daughter.   Cholesterol  Patient is taking simvastatin 40 mg once daily.   Pt denies changes in vision, chest tightness/pressure, palpitations, shortness of breath, leg pain while walking, leg or arm weakness, and sudden loss of speech or loss of consciousness.   Social History   Tobacco Use  . Smoking status: Never Smoker  . Smokeless tobacco: Never Used  Substance Use Topics  . Alcohol use: No  . Drug use: No    Review of Systems Per HPI unless specifically indicated above     Objective:    BP (!) 145/63   Pulse 78   Resp 16   Ht 5\' 2"  (1.575 m)   Wt 152 lb 6.4 oz (69.1 kg)   SpO2 98%   BMI 27.87 kg/m   Wt Readings from Last 3  Encounters:  12/01/18 152 lb 6.4 oz (69.1 kg)  10/13/18 152 lb 12.5 oz (69.3 kg)  08/07/18 150 lb 3.2 oz (68.1 kg)    Physical Exam Vitals signs reviewed.  Constitutional:      General: She is awake. She is not in acute distress.    Appearance: She is well-developed.  HENT:     Head: Normocephalic and atraumatic.  Neck:     Musculoskeletal: Normal range of motion and neck supple.     Vascular: No carotid bruit.  Cardiovascular:     Rate and Rhythm: Normal rate and regular rhythm.     Pulses:          Radial pulses are 2+ on the right side and 2+ on the left side.       Posterior tibial pulses are 1+ on the right side and 1+ on the left side.     Heart sounds: Normal heart sounds, S1 normal and S2 normal.  Pulmonary:     Effort: Pulmonary effort is normal. No respiratory distress.     Breath sounds: Normal breath sounds and air entry.  Skin:    General: Skin is warm and dry.  Neurological:     Mental Status: She is alert and oriented to person, place, and time.  Psychiatric:        Attention and Perception: Attention normal.  Mood and Affect: Mood normal. Affect is tearful.        Speech: Speech normal.        Behavior: Behavior normal. Behavior is cooperative.        Thought Content: Thought content normal. Thought content does not include homicidal or suicidal ideation. Thought content does not include homicidal or suicidal plan.        Cognition and Memory: Cognition normal.        Judgment: Judgment normal.    Results for orders placed or performed during the hospital encounter of 09/26/18  I-STAT creatinine  Result Value Ref Range   Creatinine, Ser 0.70 0.44 - 1.00 mg/dL      Assessment & Plan:   Problem List Items Addressed This Visit      Cardiovascular and Mediastinum   Essential hypertension Uncontrolled hypertension.  BP goal < 130/80.  Pt is working on lifestyle modifications.  Taking medications tolerating well without side effects. No current  complications.  Plan: 1. Continue taking lisinopril 20 mg once daily - INCREASE HCTZ to 25 mg once daily 2. Obtain labs CMP, Lipid in next 7 days 3. Encouraged heart healthy diet and increasing exercise to 30 minutes most days of the week. 4. Check BP 1-2 x per week at home, keep log, and bring to clinic at next appointment. 5. Follow up 6 months. Call clinic sooner if BP readings not improving.   Relevant Medications   hydrochlorothiazide (HYDRODIURIL) 25 MG tablet   simvastatin (ZOCOR) 40 MG tablet   lisinopril (PRINIVIL,ZESTRIL) 20 MG tablet   Other Relevant Orders   Lipid panel   COMPLETE METABOLIC PANEL WITH GFR     Other   Hyperlipidemia Current status unknown. Previously stable.  Recheck labs.  Continue meds without changes today.  Refills provided. Followup prn after labs and in 6 months.   Relevant Medications   hydrochlorothiazide (HYDRODIURIL) 25 MG tablet   simvastatin (ZOCOR) 40 MG tablet   lisinopril (PRINIVIL,ZESTRIL) 20 MG tablet   Anxiety and depression - Primary Generally stable on medications, but patient with ongoing tearfulness and difficulty feeling contribution to community. Denies SI/HI and has no plans to carry out if SI/HI arise.   Plan: 1. Continue to encourage community engagement with volunteering. 2. Encouraged patient to consider increase of paroxetine 20 mg, but was declined. 3. Follow-up 6 months.   Relevant Medications   PARoxetine (PAXIL) 10 MG tablet    Other Visit Diagnoses    History of anemia: B12 deficiency     Patient with questions about sources of B12 and iron to prevent anemia.  Recent anemia status unknown.   Plan: 1. CBC with B12 levels.  Add on iron if indicated. 2. Dietary information provided in AVS. 3. Follow-up prn after labs.   Relevant Orders   CBC with Differential/Platelet   Vitamin B12      Meds ordered this encounter  Medications  . hydrochlorothiazide (HYDRODIURIL) 25 MG tablet    Sig: Take 1 tablet (25 mg  total) by mouth daily.    Dispense:  90 tablet    Refill:  1    Order Specific Question:   Supervising Provider    Answer:   Olin Hauser [[id number]]  . PARoxetine (PAXIL) 10 MG tablet    Sig: Take 1 tablet (10 mg total) by mouth daily.    Dispense:  90 tablet    Refill:  1    Order Specific Question:   Supervising Provider    Answer:  KARAMALEGOS, ALEXANDER J [[id number]]  . simvastatin (ZOCOR) 40 MG tablet    Sig: Take 1 tablet (40 mg total) by mouth at bedtime.    Dispense:  90 tablet    Refill:  3    NOTE INCREASED DOSE    Order Specific Question:   Supervising Provider    Answer:   Olin Hauser [[id number]]  . lisinopril (PRINIVIL,ZESTRIL) 20 MG tablet    Sig: Take 1 tablet (20 mg total) by mouth daily.    Dispense:  90 tablet    Refill:  1    Order Specific Question:   Supervising Provider    Answer:   Olin Hauser [[id number]]    Follow up plan: Return in about 6 months (around 06/01/2019) for hypertension, anxiety, cholesterol.  Cassell Smiles, DNP, AGPCNP-BC Adult Gerontology Primary Care Nurse Practitioner [facility] 12/01/2018, 10:20 AM

## 2018-12-01 NOTE — Patient Instructions (Addendum)
Joyce Pace,   Thank you for coming in to clinic today.  1.  Goal is less than 130/80.  If you are almost always under 140/90 this is acceptable.  2. Iron-rich foods Heme sources (absorbed better than non-heme sources): .3 ounces of meat (beef or chicken liver, clams, mussels, oysters, beef, canned sardines (in oil)  Non-heme sources (You will absorb less of these than heme sources, but important part of iron-rich diet): .Chicken, [place], ham, veal, halibut, haddock, perch, salmon, or tuna .Breakfast cereals enriched with iron .One cup of cooked beans (kidney, lima, chickpeas) .One-half cup of tofu .One cup of dried apricots .One medium baked potato .One cup of cooked enriched egg noodles .One-fourth cup of wheat germ .1 ounce of pumpkin, sesame, or squash seeds .1 ounce of peanuts, pecans, walnuts, pistachios, roasted almonds, roasted cashews, or sunflower seeds .One-half cup of dried seedless raisins, peaches, or prunes .One medium stalk of broccoli .One cup of raw spinach .One cup of pasta (cooked, it becomes 3-4 cups) .One slice of bread, half of a small pumpernickel bagel, or bran muffin .One cup of brown or enriched rice  3. Foods high in Vitamin B12:  Eat lots of healthy foods that contain vitamin B12. Ask your health care provider if you should work with a dietitian. Foods that contain vitamin B12 include: ? Meat. ? Meat from birds (poultry). ? Fish. ? Eggs. ? Cereal and dairy products that are fortified. This means that vitamin B12 has been added to the food. Check the label on the package to see if the food is fortified.  Please schedule a follow-up appointment with Cassell Smiles, AGNP. Return in about 6 months (around 06/01/2019) for hypertension, anxiety, cholesterol.  If you have any other questions or concerns, please feel free to call the clinic or send a message through [facility]. You may also schedule an earlier appointment if necessary.  You will  receive a survey after today's visit either digitally by e-mail or paper by [employer]. Your experiences and feedback matter to [place].  Please respond so we know how we are doing as we provide care for you.  Cassell Smiles, DNP, AGNP-BC Adult Gerontology Nurse Practitioner [place]

## 2018-12-02 ENCOUNTER — Other Ambulatory Visit: Payer: Medicare Other

## 2018-12-03 LAB — COMPLETE METABOLIC PANEL WITH GFR
AG Ratio: 1.4 (calc) (ref 1.0–2.5)
ALT: 13 U/L (ref 6–29)
AST: 17 U/L (ref 10–35)
Albumin: 4.2 g/dL (ref 3.6–5.1)
Alkaline phosphatase (APISO): 96 U/L (ref 33–130)
BUN: 14 mg/dL (ref 7–25)
CO2: 31 mmol/L (ref 20–32)
Calcium: 9.7 mg/dL (ref 8.6–10.4)
Chloride: 103 mmol/L (ref 98–110)
Creat: 0.83 mg/dL (ref 0.50–0.99)
GFR, Est African American: 86 mL/min/{1.73_m2} (ref >=60)
GFR, Est Non African American: 74 mL/min/{1.73_m2} (ref >=60)
Globulin: 2.9 g/dL (calc) (ref 1.9–3.7)
Glucose, Bld: 98 mg/dL (ref 65–99)
Potassium: 4.3 mmol/L (ref 3.5–5.3)
Sodium: 140 mmol/L (ref 135–146)
Total Bilirubin: 1.2 mg/dL (ref 0.2–1.2)
Total Protein: 7.1 g/dL (ref 6.1–8.1)

## 2018-12-03 LAB — CBC WITH DIFFERENTIAL/PLATELET
Absolute Monocytes: 292 cells/uL (ref 200–950)
Basophils Absolute: 30 cells/uL (ref 0–200)
Basophils Relative: 0.7 %
Eosinophils Absolute: 262 cells/uL (ref 15–500)
Eosinophils Relative: 6.1 %
HCT: 41.6 % (ref 35.0–45.0)
Hemoglobin: 13.4 g/dL (ref 11.7–15.5)
Lymphs Abs: 1608 cells/uL (ref 850–3900)
MCH: 24.7 pg — ABNORMAL LOW (ref 27.0–33.0)
MCHC: 32.2 g/dL (ref 32.0–36.0)
MCV: 76.8 fL — ABNORMAL LOW (ref 80.0–100.0)
MPV: 11.4 fL (ref 7.5–12.5)
Monocytes Relative: 6.8 %
Neutro Abs: 2107 cells/uL (ref 1500–7800)
Neutrophils Relative %: 49 %
Platelets: 222 10*3/uL (ref 140–400)
RBC: 5.42 10*6/uL — ABNORMAL HIGH (ref 3.80–5.10)
RDW: 12.9 % (ref 11.0–15.0)
Total Lymphocyte: 37.4 %
WBC: 4.3 10*3/uL (ref 3.8–10.8)

## 2018-12-03 LAB — VITAMIN B12: Vitamin B-12: 496 pg/mL (ref 200–1100)

## 2018-12-03 LAB — LIPID PANEL
Cholesterol: 186 mg/dL (ref <200)
HDL: 50 mg/dL — ABNORMAL LOW (ref >50)
LDL Cholesterol (Calc): 105 mg/dL (calc) — ABNORMAL HIGH
Non-HDL Cholesterol (Calc): 136 mg/dL (calc) — ABNORMAL HIGH (ref <130)
Total CHOL/HDL Ratio: 3.7 (calc) (ref <5.0)
Triglycerides: 190 mg/dL — ABNORMAL HIGH (ref <150)

## 2018-12-04 ENCOUNTER — Telehealth: Payer: Self-pay | Admitting: Nurse Practitioner

## 2018-12-04 NOTE — Telephone Encounter (Signed)
Pt called for lab results [phone]

## 2018-12-04 NOTE — Telephone Encounter (Signed)
Patient advised.

## 2018-12-05 ENCOUNTER — Encounter: Payer: Self-pay | Admitting: Nurse Practitioner

## 2018-12-11 ENCOUNTER — Telehealth: Payer: Self-pay | Admitting: Nurse Practitioner

## 2018-12-11 MED ORDER — ASPIRIN EC 81 MG PO TBEC: 81.0000 mg | DELAYED_RELEASE_TABLET | Freq: Every day | ORAL | Status: DC

## 2018-12-11 NOTE — Telephone Encounter (Signed)
Pt asked if she could take an aspirin a day [phone]

## 2018-12-11 NOTE — Telephone Encounter (Signed)
Patient can start taking aspirin 81 mg once daily.

## 2018-12-11 NOTE — Telephone Encounter (Signed)
Incoming call

## 2019-01-13 ENCOUNTER — Other Ambulatory Visit: Payer: Self-pay | Admitting: Nurse Practitioner

## 2019-01-13 DIAGNOSIS — I10 Essential (primary) hypertension: Secondary | ICD-10-CM

## 2019-02-11 ENCOUNTER — Other Ambulatory Visit: Payer: Self-pay | Admitting: Nurse Practitioner

## 2019-02-11 DIAGNOSIS — F329 Major depressive disorder, single episode, unspecified: Secondary | ICD-10-CM

## 2019-02-11 DIAGNOSIS — F419 Anxiety disorder, unspecified: Principal | ICD-10-CM

## 2019-02-25 ENCOUNTER — Telehealth: Payer: Self-pay | Admitting: *Deleted

## 2019-02-25 ENCOUNTER — Other Ambulatory Visit: Payer: Self-pay | Admitting: *Deleted

## 2019-02-25 DIAGNOSIS — D509 Iron deficiency anemia, unspecified: Secondary | ICD-10-CM

## 2019-02-25 NOTE — Telephone Encounter (Signed)
Pt called wanting to know when her next appt is and when her scan will be done. I gave her the next appt 6/9 and ct scan in November. Patient wanted me to mail it to her. I have printed it and put it to be mailed

## 2019-04-14 ENCOUNTER — Inpatient Hospital Stay: Payer: Medicare Other

## 2019-04-14 ENCOUNTER — Inpatient Hospital Stay: Payer: Medicare Other | Admitting: Oncology

## 2019-04-21 ENCOUNTER — Other Ambulatory Visit: Payer: Medicare Other

## 2019-04-21 ENCOUNTER — Inpatient Hospital Stay: Payer: Medicare Other

## 2019-04-22 ENCOUNTER — Inpatient Hospital Stay: Payer: Medicare Other | Attending: Oncology

## 2019-04-22 ENCOUNTER — Other Ambulatory Visit: Payer: Self-pay

## 2019-04-22 DIAGNOSIS — Z7982 Long term (current) use of aspirin: Secondary | ICD-10-CM | POA: Diagnosis not present

## 2019-04-22 DIAGNOSIS — Z85038 Personal history of other malignant neoplasm of large intestine: Secondary | ICD-10-CM | POA: Diagnosis not present

## 2019-04-22 DIAGNOSIS — Z79899 Other long term (current) drug therapy: Secondary | ICD-10-CM | POA: Diagnosis not present

## 2019-04-22 DIAGNOSIS — F419 Anxiety disorder, unspecified: Secondary | ICD-10-CM | POA: Diagnosis not present

## 2019-04-22 DIAGNOSIS — R718 Other abnormality of red blood cells: Secondary | ICD-10-CM | POA: Diagnosis not present

## 2019-04-22 DIAGNOSIS — E538 Deficiency of other specified B group vitamins: Secondary | ICD-10-CM | POA: Insufficient documentation

## 2019-04-22 DIAGNOSIS — R918 Other nonspecific abnormal finding of lung field: Secondary | ICD-10-CM | POA: Insufficient documentation

## 2019-04-22 DIAGNOSIS — D509 Iron deficiency anemia, unspecified: Secondary | ICD-10-CM

## 2019-04-22 DIAGNOSIS — I10 Essential (primary) hypertension: Secondary | ICD-10-CM | POA: Diagnosis not present

## 2019-04-22 LAB — CBC WITH DIFFERENTIAL/PLATELET
Abs Immature Granulocytes: 0 10*3/uL (ref 0.00–0.07)
Basophils Absolute: 0 10*3/uL (ref 0.0–0.1)
Basophils Relative: 1 %
Eosinophils Absolute: 0.3 10*3/uL (ref 0.0–0.5)
Eosinophils Relative: 6 %
HCT: 38.7 % (ref 36.0–46.0)
Hemoglobin: 12.1 g/dL (ref 12.0–15.0)
Immature Granulocytes: 0 %
Lymphocytes Relative: 37 %
Lymphs Abs: 1.7 10*3/uL (ref 0.7–4.0)
MCH: 24.5 pg — ABNORMAL LOW (ref 26.0–34.0)
MCHC: 31.3 g/dL (ref 30.0–36.0)
MCV: 78.3 fL — ABNORMAL LOW (ref 80.0–100.0)
Monocytes Absolute: 0.3 10*3/uL (ref 0.1–1.0)
Monocytes Relative: 7 %
Neutro Abs: 2.3 10*3/uL (ref 1.7–7.7)
Neutrophils Relative %: 49 %
Platelets: 212 10*3/uL (ref 150–400)
RBC: 4.94 MIL/uL (ref 3.87–5.11)
RDW: 14 % (ref 11.5–15.5)
WBC: 4.5 10*3/uL (ref 4.0–10.5)
nRBC: 0 % (ref 0.0–0.2)

## 2019-04-22 LAB — IRON AND TIBC
Iron: 74 ug/dL (ref 28–170)
Saturation Ratios: 22 % (ref 10.4–31.8)
TIBC: 341 ug/dL (ref 250–450)
UIBC: 268 ug/dL

## 2019-04-22 LAB — FERRITIN: Ferritin: 36 ng/mL (ref 11–307)

## 2019-04-22 LAB — VITAMIN B12: Vitamin B-12: 292 pg/mL (ref 180–914)

## 2019-04-23 ENCOUNTER — Inpatient Hospital Stay (HOSPITAL_BASED_OUTPATIENT_CLINIC_OR_DEPARTMENT_OTHER): Payer: Medicare Other | Admitting: Oncology

## 2019-04-23 ENCOUNTER — Encounter: Payer: Self-pay | Admitting: Oncology

## 2019-04-23 DIAGNOSIS — Z08 Encounter for follow-up examination after completed treatment for malignant neoplasm: Secondary | ICD-10-CM

## 2019-04-23 DIAGNOSIS — Z9221 Personal history of antineoplastic chemotherapy: Secondary | ICD-10-CM | POA: Diagnosis not present

## 2019-04-23 DIAGNOSIS — R918 Other nonspecific abnormal finding of lung field: Secondary | ICD-10-CM

## 2019-04-23 DIAGNOSIS — D7589 Other specified diseases of blood and blood-forming organs: Secondary | ICD-10-CM

## 2019-04-23 DIAGNOSIS — R718 Other abnormality of red blood cells: Secondary | ICD-10-CM

## 2019-04-23 DIAGNOSIS — Z85038 Personal history of other malignant neoplasm of large intestine: Secondary | ICD-10-CM | POA: Diagnosis not present

## 2019-04-23 DIAGNOSIS — E538 Deficiency of other specified B group vitamins: Secondary | ICD-10-CM

## 2019-04-23 NOTE — Progress Notes (Signed)
I connected with Joyce Pace on 04/23/19 at 11:30 AM EDT by video enabled telemedicine visit and verified that I am speaking with the correct person using two identifiers.   I discussed the limitations, risks, security and privacy concerns of performing an evaluation and management service by telemedicine and the availability of in-person appointments. I also discussed with the patient that there may be a patient responsible charge related to this service. The patient expressed understanding and agreed to proceed.  Other persons participating in the visit and their role in the encounter:  none  Patient's location:  car Provider's location:  home  Chief Complaint:  1. H/o colon cancer 2. Microcytosis without anemia 3. b12 deficiency  History of present illness: Patient is a 68 year old female with a prior history of colon cancer that was diagnosed in 68 in [place].This was stage IIa PT 3 PN 0 grade 2. Final pathology showed moderately differentiated adenocarcinoma extensively ulcerated with extension into muscularis propria and pericolonic adipose tissue. Maximum size of the tumor was 5 cm with significant luminal obstruction. Margins were -15 lymph nodes were negative for malignancy. Patient underwent 12 cycles of adjuvant FOLFOX in September 2014. She also subsequently had surveillance colonoscopy done and most recently was seen by Dr. Bonna Gains and underwent EGD and colonoscopy which was unremarkable. Colonoscopy in August 2019 showed a 5 mm sigmoid polyp which was negative for malignancy. Last CT scan was in September 2017 which showed no evidence of malignancy. Most recent CBC from 06/19/2018 showed white count of 6.3, H&H of 12.1/37.5 with an MCV of 75.8 and a platelet count of 168.   Interval history. She feels well. Appetite and weight are stable.denies any blood in stool or urine   Review of Systems  Constitutional: Negative for chills, fever, malaise/fatigue and  weight loss.  HENT: Negative for congestion, ear discharge and nosebleeds.   Eyes: Negative for blurred vision.  Respiratory: Negative for cough, hemoptysis, sputum production, shortness of breath and wheezing.   Cardiovascular: Negative for chest pain, palpitations, orthopnea and claudication.  Gastrointestinal: Negative for abdominal pain, blood in stool, constipation, diarrhea, heartburn, melena, nausea and vomiting.  Genitourinary: Negative for dysuria, flank pain, frequency, hematuria and urgency.  Musculoskeletal: Negative for back pain, joint pain and myalgias.  Skin: Negative for rash.  Neurological: Negative for dizziness, tingling, focal weakness, seizures, weakness and headaches.  Endo/Heme/Allergies: Does not bruise/bleed easily.  Psychiatric/Behavioral: Negative for depression and suicidal ideas. The patient does not have insomnia.     No Known Allergies  Past Medical History:  Diagnosis Date  . Anxiety   . Colon cancer ([place]) 2014  . Hypertension     Past Surgical History:  Procedure Laterality Date  . CHOLECYSTECTOMY    . COLON SURGERY  2016   colon resection  . COLONOSCOPY WITH PROPOFOL N/A 06/12/2018   Procedure: COLONOSCOPY WITH PROPOFOL;  Surgeon: Virgel Manifold, MD;  Location: [facility] ENDOSCOPY;  Service: Endoscopy;  Laterality: N/A;  . ESOPHAGOGASTRODUODENOSCOPY (EGD) WITH PROPOFOL N/A 06/12/2018   Procedure: ESOPHAGOGASTRODUODENOSCOPY (EGD) WITH PROPOFOL;  Surgeon: Virgel Manifold, MD;  Location: [facility] ENDOSCOPY;  Service: Endoscopy;  Laterality: N/A;    Social History   Socioeconomic History  . Marital status: Married    Spouse name: Not on file  . Number of children: Not on file  . Years of education: Not on file  . Highest education level: High school graduate  Occupational History  . Not on file  Social Needs  . Financial resource strain: Not  very hard  . Food insecurity    Worry: Never true    Inability: Never true  . Transportation needs     Medical: No    Non-medical: No  Tobacco Use  . Smoking status: Never Smoker  . Smokeless tobacco: Never Used  Substance and Sexual Activity  . Alcohol use: No  . Drug use: No  . Sexual activity: Not Currently  Lifestyle  . Physical activity    Days per week: 2 days    Minutes per session: 30 min  . Stress: To some extent  Relationships  . Social Herbalist on phone: Three times a week    Gets together: More than three times a week    Attends religious service: More than 4 times per year    Active member of club or organization: Yes    Attends meetings of clubs or organizations: More than 4 times per year    Relationship status: Married  . Intimate partner violence    Fear of current or ex partner: No    Emotionally abused: No    Physically abused: No    Forced sexual activity: No  Other Topics Concern  . Not on file  Social History Narrative  . Not on file    Family History  Problem Relation Age of Onset  . Hyperlipidemia Mother   . Hypertension Mother   . Diabetes Father   . Hypertension Sister   . Hyperlipidemia Sister   . Hyperlipidemia Brother   . Hypertension Brother   . Ovarian cancer Daughter   . Healthy Son   . Heart disease Maternal Grandmother   . Heart disease Maternal Grandfather   . Heart disease Paternal Grandmother   . Heart disease Paternal Grandfather   . Hyperlipidemia Sister   . Healthy Daughter      Current Outpatient Medications:  .  aspirin EC 81 MG tablet, Take 1 tablet (81 mg total) by mouth daily. (Patient taking differently: Take 81 mg by mouth every 6 (six) hours as needed. ), Disp: , Rfl:  .  hydrochlorothiazide (HYDRODIURIL) 25 MG tablet, Take 1 tablet (25 mg total) by mouth daily., Disp: 90 tablet, Rfl: 1 .  lisinopril (PRINIVIL,ZESTRIL) 20 MG tablet, TAKE 1 TABLET BY MOUTH DAILY, Disp: 90 tablet, Rfl: 1 .  PARoxetine (PAXIL) 10 MG tablet, TAKE 1 TABLET BY MOUTH DAILY (Patient taking differently: Take 10 mg by mouth  as needed. ), Disp: 90 tablet, Rfl: 1 .  simvastatin (ZOCOR) 40 MG tablet, Take 1 tablet (40 mg total) by mouth at bedtime., Disp: 90 tablet, Rfl: 3  No results found.  No images are attached to the encounter.   CMP Latest Ref Rng & Units 12/02/2018  Glucose 65 - 99 mg/dL 98  BUN 7 - 25 mg/dL 14  Creatinine 0.50 - 0.99 mg/dL 0.83  Sodium 135 - 146 mmol/L 140  Potassium 3.5 - 5.3 mmol/L 4.3  Chloride 98 - 110 mmol/L 103  CO2 20 - 32 mmol/L 31  Calcium 8.6 - 10.4 mg/dL 9.7  Total Protein 6.1 - 8.1 g/dL 7.1  Total Bilirubin 0.2 - 1.2 mg/dL 1.2  Alkaline Phos 38 - 126 U/L -  AST 10 - 35 U/L 17  ALT 6 - 29 U/L 13   CBC Latest Ref Rng & Units 04/22/2019  WBC 4.0 - 10.5 K/uL 4.5  Hemoglobin 12.0 - 15.0 g/dL 12.1  Hematocrit 36.0 - 46.0 % 38.7  Platelets 150 - 400 K/uL 212  Observation/objective:appears in no acute distress over video visit. Breathing is non labored  Assessment and plan:patient is a 68 yr old female with h/o stage II colon cancer in 2014. She has following issues:  1. H/o colon cancer in 2014- she is now >5 years out of colon cancer diagnosis. Per nccn guidelines surveillance scans/CEA not recommended  2. Microcytosis without anemia: this is chronic over last 1 year since she established care. Ferritin levels wax and wane between 35-66. They are not trending down. Iron studies are normal. I do not think she has iron deficiency.  3. B12 deficiency- she is taking 500 mcg daily. Her level was 292. I have asked her to increase dose to 1000 mcg daily  4. Lung nodules: small sub cm. Repeat imaging in nov 2020  Follow-up instructions: CBC ferritin and iron studies and B12 in 3 months in 6 months.  See Dr. Janese Banks in 6 months  I discussed the assessment and treatment plan with the patient. The patient was provided an opportunity to ask questions and all were answered. The patient agreed with the plan and demonstrated an understanding of the instructions.   The patient was  advised to call back or seek an in-person evaluation if the symptoms worsen or if the condition fails to improve as anticipated.   Visit Diagnosis: 1. Encounter for follow-up surveillance of colon cancer   2. Microcytosis   3. B12 deficiency     Dr. Randa Evens, MD, MPH [facility] [phone] 04/23/2019 11:58 AM

## 2019-06-22 NOTE — Progress Notes (Signed)
Subjective:   Joyce Pace is a 68 y.o. female who presents for an Initial Medicare Annual Wellness Visit.  This visit is being conducted via phone call  - after an attmept to do on video chat - due to the COVID-19 pandemic. This patient has given me verbal consent via phone to conduct this visit, patient states they are participating from their home address. Some vital signs may be absent or patient reported.   Patient identification: identified by name, DOB, and current address.    Review of Systems      Cardiac Risk Factors include: advanced age (>52men, >70 women);dyslipidemia;hypertension     Objective:    Today's Vitals   06/23/19 1013  BP: (!) 150/75  Pulse: 71  Weight: 151 lb (68.5 kg)  Height: 5\' 5"  (1.651 m)   Body mass index is 25.13 kg/m.  Advanced Directives 06/23/2019 04/23/2019 10/13/2018 08/07/2018 06/18/2018 06/12/2018 09/07/2017  Does Patient Have a Medical Advance Directive? No No No No No No No  Would patient like information on creating a medical advance directive? - No - Patient declined No - Patient declined No - Patient declined Yes (MAU/Ambulatory/Procedural Areas - Information given) No - Patient declined -    Current Medications (verified) Outpatient Encounter Medications as of 06/23/2019  Medication Sig  . aspirin EC 81 MG tablet Take 1 tablet (81 mg total) by mouth daily.  . hydrochlorothiazide (HYDRODIURIL) 25 MG tablet Take 1 tablet (25 mg total) by mouth daily.  Marland Kitchen lisinopril (PRINIVIL,ZESTRIL) 20 MG tablet TAKE 1 TABLET BY MOUTH DAILY  . PARoxetine (PAXIL) 10 MG tablet TAKE 1 TABLET BY MOUTH DAILY (Patient taking differently: Take 10 mg by mouth as needed. )  . simvastatin (ZOCOR) 40 MG tablet Take 1 tablet (40 mg total) by mouth at bedtime.   No facility-administered encounter medications on file as of 06/23/2019.     Allergies (verified) Patient has no known allergies.   History: Past Medical History:  Diagnosis Date  . Anxiety   .  Colon cancer ([place]) 2014  . Hypertension    Past Surgical History:  Procedure Laterality Date  . CHOLECYSTECTOMY    . COLON SURGERY  2016   colon resection  . COLONOSCOPY WITH PROPOFOL N/A 06/12/2018   Procedure: COLONOSCOPY WITH PROPOFOL;  Surgeon: Virgel Manifold, MD;  Location: [facility] ENDOSCOPY;  Service: Endoscopy;  Laterality: N/A;  . ESOPHAGOGASTRODUODENOSCOPY (EGD) WITH PROPOFOL N/A 06/12/2018   Procedure: ESOPHAGOGASTRODUODENOSCOPY (EGD) WITH PROPOFOL;  Surgeon: Virgel Manifold, MD;  Location: [facility] ENDOSCOPY;  Service: Endoscopy;  Laterality: N/A;   Family History  Problem Relation Age of Onset  . Hyperlipidemia Mother   . Hypertension Mother   . Diabetes Father   . Hypertension Sister   . Hyperlipidemia Sister   . Hyperlipidemia Brother   . Hypertension Brother   . Ovarian cancer Daughter   . Healthy Son   . Heart disease Maternal Grandmother   . Heart disease Maternal Grandfather   . Heart disease Paternal Grandmother   . Heart disease Paternal Grandfather   . Hyperlipidemia Sister   . Healthy Daughter    Social History   Socioeconomic History  . Marital status: Married    Spouse name: Not on file  . Number of children: Not on file  . Years of education: Not on file  . Highest education level: High school graduate  Occupational History  . Occupation: retired  Scientific laboratory technician  . Financial resource strain: Not very hard  . Food insecurity  Worry: Never true    Inability: Never true  . Transportation needs    Medical: No    Non-medical: No  Tobacco Use  . Smoking status: Never Smoker  . Smokeless tobacco: Never Used  Substance and Sexual Activity  . Alcohol use: No  . Drug use: No  . Sexual activity: Not Currently  Lifestyle  . Physical activity    Days per week: 7 days    Minutes per session: 30 min  . Stress: To some extent  Relationships  . Social Herbalist on phone: Three times a week    Gets together: More than three times a  week    Attends religious service: More than 4 times per year    Active member of club or organization: Yes    Attends meetings of clubs or organizations: More than 4 times per year    Relationship status: Married  Other Topics Concern  . Not on file  Social History Narrative  . Not on file    Tobacco Counseling Counseling given: Not Answered   Clinical Intake:  Pre-visit preparation completed: Yes  Pain : No/denies pain     Diabetes: No  How often do you need to have someone help you when you read instructions, pamphlets, or other written materials from your doctor or pharmacy?: 1 - Never  Interpreter Needed?: No  Information entered by :: Tiffany Hill,LPN   Activities of Daily Living In your present state of health, do you have any difficulty performing the following activities: 06/23/2019 12/01/2018  Hearing? N N  Comment no hearing aids -  Vision? N N  Comment reading glasses -  Difficulty concentrating or making decisions? N N  Walking or climbing stairs? N N  Dressing or bathing? N N  Doing errands, shopping? N N  Preparing Food and eating ? N -  Using the Toilet? N -  In the past six months, have you accidently leaked urine? N -  Do you have problems with loss of bowel control? N -  Managing your Medications? N -  Managing your Finances? N -  Housekeeping or managing your Housekeeping? N -  Some recent data might be hidden     Immunizations and Health Maintenance Immunization History  Administered Date(s) Administered  . Pneumococcal Conjugate-13 06/18/2018  . Zoster Recombinat (Shingrix) 01/02/2019   Health Maintenance Due  Topic Date Due  . MAMMOGRAM  02/10/2003  . DEXA SCAN  02/09/2018  . INFLUENZA VACCINE  06/06/2019  . PNA vac Low Risk Adult (2 of 2 - PPSV23) 06/19/2019    Patient Care Team: Mikey College, NP as PCP - General (Nurse Practitioner) Virgel Manifold, MD as Consulting Physician (Gastroenterology)  Indicate any  recent Medical Services you may have received from other than [facility] providers in the past year (date may be approximate).     Assessment:   This is a routine wellness examination for [place].  Hearing/Vision screen  Hearing Screening   125Hz  250Hz  500Hz  1000Hz  2000Hz  3000Hz  4000Hz  6000Hz  8000Hz   Right ear:           Left ear:           Vision Screening Comments: Goes to [facility]  Dietary issues and exercise activities discussed: Current Exercise Habits: Home exercise routine, Type of exercise: walking, Time (Minutes): 20, Frequency (Times/Week): 7, Weekly Exercise (Minutes/Week): 140, Intensity: Mild, Exercise limited by: None identified  Goals    . DIET - EAT MORE FRUITS  AND VEGETABLES      Depression Screen PHQ 2/9 Scores 06/23/2019 12/01/2018 06/18/2018 04/07/2018 02/19/2018  PHQ - 2 Score 1 0 3 0 1  PHQ- 9 Score - 0 3 2 -    Fall Risk Fall Risk  06/23/2019 12/01/2018 02/19/2018 02/19/2018  Falls in the past year? 0 0 No No  Number falls in past yr: - 0 - -  Injury with Fall? - 0 - -   FALL RISK PREVENTION PERTAINING TO THE HOME:  Any stairs in or around the home? Yes  If so, are there any without handrails? No   Home free of loose throw rugs in walkways, pet beds, electrical cords, etc? Yes  Adequate lighting in your home to reduce risk of falls? Yes   ASSISTIVE DEVICES UTILIZED TO PREVENT FALLS:  Life alert? No  Use of a cane, walker or w/c? No  Grab bars in the bathroom? No  Shower chair or bench in shower? No  Elevated toilet seat or a handicapped toilet? No    DME ORDERS:  DME order needed?  No   TIMED UP AND GO:  Unable to perform     Cognitive Function:     6CIT Screen 06/18/2018  What Year? 0 points  What month? 0 points  What time? 0 points  Count back from 20 0 points  Months in reverse 0 points  Repeat phrase 2 points  Total Score 2    Screening Tests Health Maintenance  Topic Date Due  . MAMMOGRAM  02/10/2003  . DEXA SCAN  02/09/2018   . INFLUENZA VACCINE  06/06/2019  . PNA vac Low Risk Adult (2 of 2 - PPSV23) 06/19/2019  . TETANUS/TDAP  06/22/2020 (Originally 02/10/1970)  . COLONOSCOPY  06/13/2023  . Hepatitis C Screening  Completed    Qualifies for Shingles Vaccine? Shingrix first dose completed 01/02/2019  Tdap: Discussed need for TD/TDAP vaccine, patient verbalized understanding that this is not covered as a preventative with there insurance and to call the office if she develops any new skin injuries, ie: cuts, scrapes, bug bites, or open wounds.  Flu Vaccine: Due 07/2019  Pneumococcal Vaccine: pneumovax 23 due at next appt   Cancer Screenings:   Colorectal Screening: completed 06/12/2018  Mammogram: due now, ordered  Bone Density: due now, ordered  Lung Cancer Screening: (Low Dose CT Chest recommended if Age 50-80 years, 30 pack-year currently smoking OR have quit w/in 15years.) does not qualify.    Additional Screening:  Hepatitis C Screening: does not qualify; Completed 06/19/2018  Vision Screening: Recommended annual ophthalmology exams for early detection of glaucoma and other disorders of the eye. Is the patient up to date with their annual eye exam?  Yes  Who is the provider or what is the name of the office in which the pt attends annual eye exams? [facility]    Dental Screening: Recommended annual dental exams for proper oral hygiene  Community Resource Referral:  CRR required this visit?  No       Plan:  I have personally reviewed and addressed the Medicare Annual Wellness questionnaire and have noted the following in the patient's chart:  A. Medical and social history B. Use of alcohol, tobacco or illicit drugs  C. Current medications and supplements D. Functional ability and status E.  Nutritional status F.  Physical activity G. Advance directives H. List of other physicians I.  Hospitalizations, surgeries, and ER visits in previous 12 months J.  [place] such as  hearing and vision if needed, cognitive and depression L. Referrals and appointments   In addition, I have reviewed and discussed with patient certain preventive protocols, quality metrics, and best practice recommendations. A written personalized care plan for preventive services as well as general preventive health recommendations were provided to patient.   Signed,    Bevelyn Ngo, LPN   07/28/3006  Nurse Health Advisor   Nurse Notes: discussed med compliance with HCTZ, patient wasn't taking regularly. Discussed importance of taking this medication as prescribed and checking BP's at home. She verbalized understanding and will start taking medication once a day as prescribed with her lisinopril and checking her BP once a day and write down her readings.

## 2019-06-23 ENCOUNTER — Ambulatory Visit (INDEPENDENT_AMBULATORY_CARE_PROVIDER_SITE_OTHER): Payer: Medicare Other

## 2019-06-23 VITALS — BP 150/75 | HR 71 | Ht 65.0 in | Wt 151.0 lb

## 2019-06-23 DIAGNOSIS — Z0000 Encounter for general adult medical examination without abnormal findings: Secondary | ICD-10-CM

## 2019-06-23 NOTE — Patient Instructions (Signed)
[name] , Thank you for taking time to come for your Medicare Wellness Visit. I appreciate your ongoing commitment to your health goals. Please review the following plan we discussed and let me know if I can assist you in the future.   Screening recommendations/referrals: Colonoscopy: completed 06/12/2018 Mammogram: Please call [phone] to schedule your mammogram.  Bone Density: Please call [phone] to schedule Recommended yearly ophthalmology/optometry visit for glaucoma screening and checkup Recommended yearly dental visit for hygiene and checkup  Vaccinations: Influenza vaccine: due 07/2019 Pneumococcal vaccine: pneumovax 23 due  Tdap vaccine: due, check with your insurance for coverage Shingles vaccine: shingrix, first dose completed   Advanced directives: please pick up a copy of this information next time you are in the office  Conditions/risks identified: discussed med compliance   Next appointment: Follow up in one year for your annual wellness visit    Preventive Care 65 Years and Older, Female Preventive care refers to lifestyle choices and visits with your health care provider that can promote health and wellness. What does preventive care include?  A yearly physical exam. This is also called an annual well check.  Dental exams once or twice a year.  Routine eye exams. Ask your health care provider how often you should have your eyes checked.  Personal lifestyle choices, including:  Daily care of your teeth and gums.  Regular physical activity.  Eating a healthy diet.  Avoiding tobacco and drug use.  Limiting alcohol use.  Practicing safe sex.  Taking low-dose aspirin every day.  Taking vitamin and mineral supplements as recommended by your health care provider. What happens during an annual well check? The services and screenings done by your health care provider during your annual well check will depend on your age, overall health,  lifestyle risk factors, and family history of disease. Counseling  Your health care provider may ask you questions about your:  Alcohol use.  Tobacco use.  Drug use.  Emotional well-being.  Home and relationship well-being.  Sexual activity.  Eating habits.  History of falls.  Memory and ability to understand (cognition).  Work and work Statistician.  Reproductive health. Screening  You may have the following tests or measurements:  Height, weight, and BMI.  Blood pressure.  Lipid and cholesterol levels. These may be checked every 5 years, or more frequently if you are over 45 years old.  Skin check.  Lung cancer screening. You may have this screening every year starting at age 18 if you have a 30-pack-year history of smoking and currently smoke or have quit within the past 15 years.  Fecal occult blood test (FOBT) of the stool. You may have this test every year starting at age 23.  Flexible sigmoidoscopy or colonoscopy. You may have a sigmoidoscopy every 5 years or a colonoscopy every 10 years starting at age 32.  Hepatitis C blood test.  Hepatitis B blood test.  Sexually transmitted disease (STD) testing.  Diabetes screening. This is done by checking your blood sugar (glucose) after you have not eaten for a while (fasting). You may have this done every 1-3 years.  Bone density scan. This is done to screen for osteoporosis. You may have this done starting at age 72.  Mammogram. This may be done every 1-2 years. Talk to your health care provider about how often you should have regular mammograms. Talk with your health care provider about your test results, treatment options, and if necessary, the need for more tests. Vaccines  Your  health care provider may recommend certain vaccines, such as:  Influenza vaccine. This is recommended every year.  Tetanus, diphtheria, and acellular pertussis (Tdap, Td) vaccine. You may need a Td booster every 10 years.  Zoster  vaccine. You may need this after age 62.  Pneumococcal 13-valent conjugate (PCV13) vaccine. One dose is recommended after age 56.  Pneumococcal polysaccharide (PPSV23) vaccine. One dose is recommended after age 68. Talk to your health care provider about which screenings and vaccines you need and how often you need them. This information is not intended to replace advice given to you by your health care provider. Make sure you discuss any questions you have with your health care provider. Document Released: 11/18/2015 Document Revised: 07/11/2016 Document Reviewed: 08/23/2015 Elsevier Interactive Patient Education  2017 [place] Prevention in the Home Falls can cause injuries. They can happen to people of all ages. There are many things you can do to make your home safe and to help prevent falls. What can I do on the outside of my home?  Regularly fix the edges of walkways and driveways and fix any cracks.  Remove anything that might make you trip as you walk through a door, such as a raised step or threshold.  Trim any bushes or trees on the path to your home.  Use bright outdoor lighting.  Clear any walking paths of anything that might make someone trip, such as rocks or tools.  Regularly check to see if handrails are loose or broken. Make sure that both sides of any steps have handrails.  Any raised decks and porches should have guardrails on the edges.  Have any leaves, snow, or ice cleared regularly.  Use sand or salt on walking paths during winter.  Clean up any spills in your garage right away. This includes oil or grease spills. What can I do in the bathroom?  Use night lights.  Install grab bars by the toilet and in the tub and shower. Do not use towel bars as grab bars.  Use non-skid mats or decals in the tub or shower.  If you need to sit down in the shower, use a plastic, non-slip stool.  Keep the floor dry. Clean up any water that spills on the  floor as soon as it happens.  Remove soap buildup in the tub or shower regularly.  Attach bath mats securely with double-sided non-slip rug tape.  Do not have throw rugs and other things on the floor that can make you trip. What can I do in the bedroom?  Use night lights.  Make sure that you have a light by your bed that is easy to reach.  Do not use any sheets or blankets that are too big for your bed. They should not hang down onto the floor.  Have a firm chair that has side arms. You can use this for support while you get dressed.  Do not have throw rugs and other things on the floor that can make you trip. What can I do in the kitchen?  Clean up any spills right away.  Avoid walking on wet floors.  Keep items that you use a lot in easy-to-reach places.  If you need to reach something above you, use a strong step stool that has a grab bar.  Keep electrical cords out of the way.  Do not use floor polish or wax that makes floors slippery. If you must use wax, use non-skid floor wax.  Do not  have throw rugs and other things on the floor that can make you trip. What can I do with my stairs?  Do not leave any items on the stairs.  Make sure that there are handrails on both sides of the stairs and use them. Fix handrails that are broken or loose. Make sure that handrails are as long as the stairways.  Check any carpeting to make sure that it is firmly attached to the stairs. Fix any carpet that is loose or worn.  Avoid having throw rugs at the top or bottom of the stairs. If you do have throw rugs, attach them to the floor with carpet tape.  Make sure that you have a light switch at the top of the stairs and the bottom of the stairs. If you do not have them, ask someone to add them for you. What else can I do to help prevent falls?  Wear shoes that:  Do not have high heels.  Have rubber bottoms.  Are comfortable and fit you well.  Are closed at the toe. Do not wear  sandals.  If you use a stepladder:  Make sure that it is fully opened. Do not climb a closed stepladder.  Make sure that both sides of the stepladder are locked into place.  Ask someone to hold it for you, if possible.  Clearly mark and make sure that you can see:  Any grab bars or handrails.  First and last steps.  Where the edge of each step is.  Use tools that help you move around (mobility aids) if they are needed. These include:  Canes.  Walkers.  Scooters.  Crutches.  Turn on the lights when you go into a dark area. Replace any light bulbs as soon as they burn out.  Set up your furniture so you have a clear path. Avoid moving your furniture around.  If any of your floors are uneven, fix them.  If there are any pets around you, be aware of where they are.  Review your medicines with your doctor. Some medicines can make you feel dizzy. This can increase your chance of falling. Ask your doctor what other things that you can do to help prevent falls. This information is not intended to replace advice given to you by your health care provider. Make sure you discuss any questions you have with your health care provider. Document Released: 08/18/2009 Document Revised: 03/29/2016 Document Reviewed: 11/26/2014 Elsevier Interactive Patient Education  2017 [employer].

## 2019-06-29 ENCOUNTER — Ambulatory Visit: Payer: Medicare Other | Admitting: Nurse Practitioner

## 2019-07-01 ENCOUNTER — Encounter: Payer: Self-pay | Admitting: Nurse Practitioner

## 2019-07-01 ENCOUNTER — Ambulatory Visit (INDEPENDENT_AMBULATORY_CARE_PROVIDER_SITE_OTHER): Payer: Medicare Other | Admitting: Nurse Practitioner

## 2019-07-01 ENCOUNTER — Other Ambulatory Visit: Payer: Self-pay

## 2019-07-01 VITALS — BP 114/56 | HR 67 | Ht 65.0 in | Wt 151.6 lb

## 2019-07-01 DIAGNOSIS — F419 Anxiety disorder, unspecified: Secondary | ICD-10-CM | POA: Diagnosis not present

## 2019-07-01 DIAGNOSIS — E782 Mixed hyperlipidemia: Secondary | ICD-10-CM | POA: Diagnosis not present

## 2019-07-01 DIAGNOSIS — F32A Depression, unspecified: Secondary | ICD-10-CM

## 2019-07-01 DIAGNOSIS — I10 Essential (primary) hypertension: Secondary | ICD-10-CM | POA: Diagnosis not present

## 2019-07-01 DIAGNOSIS — F329 Major depressive disorder, single episode, unspecified: Secondary | ICD-10-CM

## 2019-07-01 DIAGNOSIS — Z23 Encounter for immunization: Secondary | ICD-10-CM

## 2019-07-01 MED ORDER — HYDROCHLOROTHIAZIDE 12.5 MG PO TABS: 12.5000 mg | ORAL_TABLET | Freq: Every day | ORAL | 1 refills | Status: DC

## 2019-07-01 MED ORDER — SIMVASTATIN 40 MG PO TABS: 40.0000 mg | ORAL_TABLET | Freq: Every day | ORAL | 3 refills | Status: DC

## 2019-07-01 MED ORDER — LISINOPRIL 20 MG PO TABS: 20.0000 mg | ORAL_TABLET | Freq: Every day | ORAL | 1 refills | Status: DC

## 2019-07-01 MED ORDER — PAROXETINE HCL 10 MG PO TABS: 10.0000 mg | ORAL_TABLET | Freq: Every day | ORAL | 1 refills | Status: DC

## 2019-07-01 NOTE — Patient Instructions (Addendum)
Joyce Pace,   Thank you for coming in to clinic today.  1. Take paroxetine 10 mg once daily for both anxiety and depression.  Daily dosing is required for it to work well.  It may take about 6 weeks for you to notice the difference in taking it daily. - Call if you still have panic when taking this daily.  2. REDUCE hydrochlorothiazide to 12.5 mg once daily. Continue lisinopril at 20 mg once daily. - GOAL blood pressure is less than 130/80.  Higher than 90/50 is still normal.  3. You will be due for FASTING BLOOD WORK.  This means you should eat no food or drink after midnight.  Drink only water or coffee without cream/sugar on the morning of your lab visit. - Please go ahead and schedule a "Lab Only" visit in the morning at the clinic for lab draw in the next 7 days. - Your results will be available about 2-3 days after blood draw.  If you have set up a MyChart account, you can can log in to MyChart online to view your results and a brief explanation. Also, we can discuss your results together at your next office visit if you would like.   Please schedule a follow-up appointment with Cassell Smiles, AGNP. Return in about 2 months (around 08/31/2019) for anxiety.  If you have any other questions or concerns, please feel free to call the clinic or send a message through [place]. You may also schedule an earlier appointment if necessary.  You will receive a survey after today's visit either digitally by e-mail or paper by [employer]. Your experiences and feedback matter to [place].  Please respond so we know how we are doing as we provide care for you.   Cassell Smiles, DNP, AGNP-BC Adult Gerontology Nurse Practitioner [place]

## 2019-07-01 NOTE — Progress Notes (Signed)
Subjective:    Patient ID: Joyce Pace, female    DOB: 1950-12-25, 68 y.o.   MRN: [id number]  Joyce Pace is a 68 y.o. female presenting on 07/01/2019 for Anxiety  HPI Cholesterol Patient is taking simvastatin 40 mg once daily tolerates well without side effects Pt denies changes in vision, chest tightness/pressure, palpitations, shortness of breath, leg pain while walking, leg or arm weakness, and sudden loss of speech or loss of consciousness.   Hypertension - She is not checking BP at home or outside of clinic.    - Current medications: lisinopril 20 mg once daily, tolerating well without side effects - She is not currently symptomatic.  She has occasional lightheadedness.  - Pt denies headache, lightheadedness, dizziness, changes in vision, chest tightness/pressure, palpitations, leg swelling, sudden loss of speech or loss of consciousness. - She  reports no regular exercise routine. - Her diet is moderate in salt, moderate in fat, and moderate in carbohydrates.   Anxiety follow-up  Patient continues on paroxetine 10 mg only as needed.  Social isolation is bothersome at the moment, but symptoms of anxiety. Notes increase anxiety with dropping doses of paroxetine.  Patient takes only 2-3 days a week.  Patient has physical symptoms of legs shaking, chest pain with panic.  GAD 7 : Generalized Anxiety Score 07/01/2019 12/01/2018 06/18/2018  Nervous, Anxious, on Edge 1 1 2   Control/stop worrying 1 0 0  Worry too much - different things 1 2 1   Trouble relaxing 0 3 0  Restless 0 3 0  Easily annoyed or irritable 0 0 0  Afraid - awful might happen 0 0 1  Total GAD 7 Score 3 9 4   Anxiety Difficulty Not difficult at all Somewhat difficult Not difficult at all     Depression screen [facility] 2/9 07/01/2019 06/23/2019 12/01/2018 06/18/2018 04/07/2018  Decreased Interest 1 0 0 0 0  Down, Depressed, Hopeless 1 1 0 3 0  PHQ - 2 Score 2 1 0 3 0  Altered sleeping 0 - 0 0 1  Tired,  decreased energy 1 - 0 0 1  Change in appetite 0 - 0 0 0  Feeling bad or failure about yourself  0 - 0 0 0  Trouble concentrating 0 - 0 0 0  Moving slowly or fidgety/restless 0 - 0 0 0  Suicidal thoughts 0 - 0 0 0  PHQ-9 Score 3 - 0 3 2  Difficult doing work/chores Not difficult at all - - Not difficult at all Not difficult at all     Social History   Tobacco Use  . Smoking status: Never Smoker  . Smokeless tobacco: Never Used  Substance Use Topics  . Alcohol use: No  . Drug use: No    Review of Systems Per HPI unless specifically indicated above     Objective:    BP (!) 114/56 (BP Location: Right Arm, Patient Position: Sitting, Cuff Size: Normal)   Pulse 67   Ht 5\' 5"  (1.651 m)   Wt 151 lb 9.6 oz (68.8 kg)   BMI 25.23 kg/m   Wt Readings from Last 3 Encounters:  07/01/19 151 lb 9.6 oz (68.8 kg)  06/23/19 151 lb (68.5 kg)  12/01/18 152 lb 6.4 oz (69.1 kg)    Physical Exam Vitals signs reviewed.  Constitutional:      General: She is awake. She is not in acute distress.    Appearance: She is well-developed.  HENT:     Head: Normocephalic and atraumatic.  Neck:     Musculoskeletal: Normal range of motion and neck supple.     Vascular: No carotid bruit.  Cardiovascular:     Rate and Rhythm: Normal rate and regular rhythm.     Pulses:          Radial pulses are 2+ on the right side and 2+ on the left side.       Posterior tibial pulses are 1+ on the right side and 1+ on the left side.     Heart sounds: Normal heart sounds, S1 normal and S2 normal.  Pulmonary:     Effort: Pulmonary effort is normal. No respiratory distress.     Breath sounds: Normal breath sounds and air entry.  Skin:    General: Skin is warm and dry.     Capillary Refill: Capillary refill takes less than 2 seconds.  Neurological:     General: No focal deficit present.     Mental Status: She is alert and oriented to person, place, and time. Mental status is at baseline.  Psychiatric:         Attention and Perception: Attention normal.        Mood and Affect: Mood is anxious. Affect is tearful.        Speech: Speech normal.        Behavior: Behavior normal. Behavior is cooperative.        Thought Content: Thought content does not include homicidal or suicidal ideation. Thought content does not include homicidal or suicidal plan.        Cognition and Memory: Cognition and memory normal.        Judgment: Judgment normal.     Comments: Stoic appearance     Results for orders placed or performed in visit on 04/22/19  CBC with Differential  Result Value Ref Range   WBC 4.5 4.0 - 10.5 K/uL   RBC 4.94 3.87 - 5.11 MIL/uL   Hemoglobin 12.1 12.0 - 15.0 g/dL   HCT 38.7 36.0 - 46.0 %   MCV 78.3 (L) 80.0 - 100.0 fL   MCH 24.5 (L) 26.0 - 34.0 pg   MCHC 31.3 30.0 - 36.0 g/dL   RDW 14.0 11.5 - 15.5 %   Platelets 212 150 - 400 K/uL   nRBC 0.0 0.0 - 0.2 %   Neutrophils Relative % 49 %   Neutro Abs 2.3 1.7 - 7.7 K/uL   Lymphocytes Relative 37 %   Lymphs Abs 1.7 0.7 - 4.0 K/uL   Monocytes Relative 7 %   Monocytes Absolute 0.3 0.1 - 1.0 K/uL   Eosinophils Relative 6 %   Eosinophils Absolute 0.3 0.0 - 0.5 K/uL   Basophils Relative 1 %   Basophils Absolute 0.0 0.0 - 0.1 K/uL   Immature Granulocytes 0 %   Abs Immature Granulocytes 0.00 0.00 - 0.07 K/uL  Iron and TIBC  Result Value Ref Range   Iron 74 28 - 170 ug/dL   TIBC 341 250 - 450 ug/dL   Saturation Ratios 22 10.4 - 31.8 %   UIBC 268 ug/dL  Vitamin B12  Result Value Ref Range   Vitamin B-12 292 180 - 914 pg/mL  Ferritin  Result Value Ref Range   Ferritin 36 11 - 307 ng/mL      Assessment & Plan:   Problem List Items Addressed This Visit      Cardiovascular and Mediastinum   Essential hypertension - Primary Controlled hypertension.  BP goal < 130/80.  Pt is  continuing lifestyle modifications.  Taking medications tolerating well without side effects. No current complications.  Plan: 1. Continue taking meds without  changes 2. Obtain labs   3. Encouraged heart healthy diet and increasing exercise to 30 minutes most days of the week. 4. Check BP 1-2 x per week at home, keep log, and bring to clinic at next appointment. 5. Follow up 6 months.     Relevant Medications   hydrochlorothiazide (HYDRODIURIL) 12.5 MG tablet   lisinopril (ZESTRIL) 20 MG tablet   simvastatin (ZOCOR) 40 MG tablet   Other Relevant Orders   COMPLETE METABOLIC PANEL WITH GFR (Completed)     Other   Hyperlipidemia Stable today on exam.  Medications tolerated without side effects.  Continue at current doses.  Refills provided.  Check labs today. Followup 6 months.    Relevant Medications   hydrochlorothiazide (HYDRODIURIL) 12.5 MG tablet   lisinopril (ZESTRIL) 20 MG tablet   simvastatin (ZOCOR) 40 MG tablet   Other Relevant Orders   Lipid panel (Completed)   COMPLETE METABOLIC PANEL WITH GFR (Completed)   Anxiety and depression Uncontrolled today. Worsened with social isolation.  Patient not taking paroxetine as prescribed.   - take paroxetine daily prior to dose increase - Encouraged non-pharm management options - Follow-up 6-8 weeks.   Relevant Medications   PARoxetine (PAXIL) 10 MG tablet    Other Visit Diagnoses    Need for pneumococcal vaccination       Relevant Orders   Pneumococcal polysaccharide vaccine 23-valent greater than or equal to 2yo subcutaneous/IM      Meds ordered this encounter  Medications  . PARoxetine (PAXIL) 10 MG tablet    Sig: Take 1 tablet (10 mg total) by mouth daily.    Dispense:  90 tablet    Refill:  1    Order Specific Question:   Supervising Provider    Answer:   Olin Hauser [[id number]]  . hydrochlorothiazide (HYDRODIURIL) 12.5 MG tablet    Sig: Take 1 tablet (12.5 mg total) by mouth daily.    Dispense:  90 tablet    Refill:  1    Order Specific Question:   Supervising Provider    Answer:   Olin Hauser [[id number]]  . lisinopril (ZESTRIL) 20 MG tablet    Sig: Take  1 tablet (20 mg total) by mouth daily.    Dispense:  90 tablet    Refill:  1    Order Specific Question:   Supervising Provider    Answer:   Olin Hauser [[id number]]  . simvastatin (ZOCOR) 40 MG tablet    Sig: Take 1 tablet (40 mg total) by mouth at bedtime.    Dispense:  90 tablet    Refill:  3    Order Specific Question:   Supervising Provider    Answer:   Olin Hauser [[id number]]    Follow up plan: Return in about 2 months (around 08/31/2019) for anxiety.  Cassell Smiles, DNP, AGPCNP-BC Adult Gerontology Primary Care Nurse Practitioner [facility] 07/01/2019, 10:26 AM

## 2019-07-02 ENCOUNTER — Other Ambulatory Visit: Payer: Medicare Other

## 2019-07-02 LAB — COMPLETE METABOLIC PANEL WITH GFR
AG Ratio: 1.5 (calc) (ref 1.0–2.5)
ALT: 13 U/L (ref 6–29)
AST: 17 U/L (ref 10–35)
Albumin: 4.1 g/dL (ref 3.6–5.1)
Alkaline phosphatase (APISO): 90 U/L (ref 37–153)
BUN: 18 mg/dL (ref 7–25)
CO2: 30 mmol/L (ref 20–32)
Calcium: 9.5 mg/dL (ref 8.6–10.4)
Chloride: 104 mmol/L (ref 98–110)
Creat: 0.86 mg/dL (ref 0.50–0.99)
GFR, Est African American: 82 mL/min/{1.73_m2} (ref >=60)
GFR, Est Non African American: 70 mL/min/{1.73_m2} (ref >=60)
Globulin: 2.7 g/dL (calc) (ref 1.9–3.7)
Glucose, Bld: 104 mg/dL — ABNORMAL HIGH (ref 65–99)
Potassium: 4.5 mmol/L (ref 3.5–5.3)
Sodium: 140 mmol/L (ref 135–146)
Total Bilirubin: 1.3 mg/dL — ABNORMAL HIGH (ref 0.2–1.2)
Total Protein: 6.8 g/dL (ref 6.1–8.1)

## 2019-07-02 LAB — LIPID PANEL
Cholesterol: 166 mg/dL (ref <200)
HDL: 49 mg/dL — ABNORMAL LOW (ref >=50)
LDL Cholesterol (Calc): 89 mg/dL (calc)
Non-HDL Cholesterol (Calc): 117 mg/dL (calc) (ref <130)
Total CHOL/HDL Ratio: 3.4 (calc) (ref <5.0)
Triglycerides: 179 mg/dL — ABNORMAL HIGH (ref <150)

## 2019-07-23 ENCOUNTER — Other Ambulatory Visit: Payer: Self-pay

## 2019-07-24 ENCOUNTER — Other Ambulatory Visit: Payer: Self-pay

## 2019-07-24 ENCOUNTER — Inpatient Hospital Stay: Payer: Medicare Other | Attending: Oncology

## 2019-07-24 DIAGNOSIS — Z08 Encounter for follow-up examination after completed treatment for malignant neoplasm: Secondary | ICD-10-CM

## 2019-07-24 DIAGNOSIS — Z85038 Personal history of other malignant neoplasm of large intestine: Secondary | ICD-10-CM | POA: Insufficient documentation

## 2019-07-24 DIAGNOSIS — Z9221 Personal history of antineoplastic chemotherapy: Secondary | ICD-10-CM | POA: Insufficient documentation

## 2019-07-24 DIAGNOSIS — R918 Other nonspecific abnormal finding of lung field: Secondary | ICD-10-CM | POA: Diagnosis not present

## 2019-07-24 DIAGNOSIS — D7589 Other specified diseases of blood and blood-forming organs: Secondary | ICD-10-CM | POA: Diagnosis not present

## 2019-07-24 DIAGNOSIS — D509 Iron deficiency anemia, unspecified: Secondary | ICD-10-CM | POA: Diagnosis not present

## 2019-07-24 DIAGNOSIS — E538 Deficiency of other specified B group vitamins: Secondary | ICD-10-CM | POA: Insufficient documentation

## 2019-07-24 DIAGNOSIS — R718 Other abnormality of red blood cells: Secondary | ICD-10-CM

## 2019-07-24 LAB — CBC WITH DIFFERENTIAL/PLATELET
Abs Immature Granulocytes: 0.01 10*3/uL (ref 0.00–0.07)
Basophils Absolute: 0 10*3/uL (ref 0.0–0.1)
Basophils Relative: 1 %
Eosinophils Absolute: 0.2 10*3/uL (ref 0.0–0.5)
Eosinophils Relative: 4 %
HCT: 38.4 % (ref 36.0–46.0)
Hemoglobin: 12 g/dL (ref 12.0–15.0)
Immature Granulocytes: 0 %
Lymphocytes Relative: 35 %
Lymphs Abs: 1.6 10*3/uL (ref 0.7–4.0)
MCH: 24.4 pg — ABNORMAL LOW (ref 26.0–34.0)
MCHC: 31.3 g/dL (ref 30.0–36.0)
MCV: 78.2 fL — ABNORMAL LOW (ref 80.0–100.0)
Monocytes Absolute: 0.3 10*3/uL (ref 0.1–1.0)
Monocytes Relative: 8 %
Neutro Abs: 2.3 10*3/uL (ref 1.7–7.7)
Neutrophils Relative %: 52 %
Platelets: 193 10*3/uL (ref 150–400)
RBC: 4.91 MIL/uL (ref 3.87–5.11)
RDW: 14.2 % (ref 11.5–15.5)
WBC: 4.5 10*3/uL (ref 4.0–10.5)
nRBC: 0 % (ref 0.0–0.2)

## 2019-07-24 LAB — IRON AND TIBC
Iron: 111 ug/dL (ref 28–170)
Saturation Ratios: 34 % — ABNORMAL HIGH (ref 10.4–31.8)
TIBC: 328 ug/dL (ref 250–450)
UIBC: 217 ug/dL

## 2019-07-24 LAB — FERRITIN: Ferritin: 44 ng/mL (ref 11–307)

## 2019-07-24 LAB — VITAMIN B12: Vitamin B-12: 275 pg/mL (ref 180–914)

## 2019-08-10 ENCOUNTER — Other Ambulatory Visit: Payer: Self-pay | Admitting: *Deleted

## 2019-08-10 DIAGNOSIS — E538 Deficiency of other specified B group vitamins: Secondary | ICD-10-CM

## 2019-09-23 ENCOUNTER — Ambulatory Visit: Payer: Medicare Other

## 2019-09-29 ENCOUNTER — Ambulatory Visit: Payer: Medicare Other

## 2019-10-09 ENCOUNTER — Encounter (INDEPENDENT_AMBULATORY_CARE_PROVIDER_SITE_OTHER): Payer: Self-pay

## 2019-10-09 ENCOUNTER — Ambulatory Visit
Admission: RE | Admit: 2019-10-09 | Discharge: 2019-10-09 | Disposition: A | Discharge status: Home / Self Care | Payer: Medicare Other | Source: Ambulatory Visit | Attending: Oncology | Admitting: Oncology

## 2019-10-09 ENCOUNTER — Other Ambulatory Visit: Payer: Self-pay

## 2019-10-09 DIAGNOSIS — D509 Iron deficiency anemia, unspecified: Secondary | ICD-10-CM | POA: Diagnosis present

## 2019-10-09 DIAGNOSIS — R918 Other nonspecific abnormal finding of lung field: Secondary | ICD-10-CM

## 2019-10-09 DIAGNOSIS — Z85038 Personal history of other malignant neoplasm of large intestine: Secondary | ICD-10-CM | POA: Diagnosis present

## 2019-10-21 ENCOUNTER — Other Ambulatory Visit: Payer: Medicare Other

## 2019-10-22 ENCOUNTER — Inpatient Hospital Stay: Payer: Medicare Other

## 2019-10-22 ENCOUNTER — Inpatient Hospital Stay: Payer: Medicare Other | Admitting: Oncology

## 2019-10-27 ENCOUNTER — Telehealth: Payer: Self-pay | Admitting: *Deleted

## 2019-10-27 NOTE — Telephone Encounter (Signed)
Called patient and had to leave her a voicemail letting her know that she will get her CT Scan result on her January 7th appointment. I also stated to call back if she had any further questions.

## 2019-10-27 NOTE — Telephone Encounter (Signed)
Patient called asking if she can get her CT results before her appointment on 11/12/19. Please advise if alright to give to her.  IMPRESSION: 1. No active cardiopulmonary abnormalities. 2. Stable small pulmonary nodules. Twelve month stability is compatible with a benign abnormality. No new pulmonary nodules. 3. Aortic atherosclerosis.  Aortic Atherosclerosis (ICD10-I70.0).   Electronically Signed   By: Kerby Moors M.D.   On: 10/09/2019 12:47

## 2019-11-02 ENCOUNTER — Other Ambulatory Visit: Payer: Self-pay | Admitting: Nurse Practitioner

## 2019-11-02 ENCOUNTER — Other Ambulatory Visit: Payer: Self-pay | Admitting: Family Medicine

## 2019-11-02 DIAGNOSIS — K2270 Barrett's esophagus without dysplasia: Secondary | ICD-10-CM

## 2019-11-02 MED ORDER — OMEPRAZOLE 20 MG PO CPDR: 20.0000 mg | DELAYED_RELEASE_CAPSULE | Freq: Every day | ORAL | 1 refills | Status: DC

## 2019-11-02 NOTE — Telephone Encounter (Signed)
Spoke to the patient, she was taking omeprazole 20 mg once daily in past.

## 2019-11-02 NOTE — Telephone Encounter (Signed)
Pt.  Called requesting refill on her acid reflux medication called into [employer] [facility]

## 2019-11-09 ENCOUNTER — Other Ambulatory Visit: Payer: Self-pay

## 2019-11-10 ENCOUNTER — Inpatient Hospital Stay: Payer: Medicare Other | Attending: Hematology and Oncology

## 2019-11-10 DIAGNOSIS — D509 Iron deficiency anemia, unspecified: Secondary | ICD-10-CM | POA: Diagnosis present

## 2019-11-10 DIAGNOSIS — Z85038 Personal history of other malignant neoplasm of large intestine: Secondary | ICD-10-CM | POA: Insufficient documentation

## 2019-11-10 DIAGNOSIS — E538 Deficiency of other specified B group vitamins: Secondary | ICD-10-CM | POA: Diagnosis not present

## 2019-11-10 DIAGNOSIS — Z79899 Other long term (current) drug therapy: Secondary | ICD-10-CM | POA: Insufficient documentation

## 2019-11-10 DIAGNOSIS — R718 Other abnormality of red blood cells: Secondary | ICD-10-CM

## 2019-11-10 DIAGNOSIS — Z08 Encounter for follow-up examination after completed treatment for malignant neoplasm: Secondary | ICD-10-CM

## 2019-11-10 DIAGNOSIS — R918 Other nonspecific abnormal finding of lung field: Secondary | ICD-10-CM | POA: Insufficient documentation

## 2019-11-10 LAB — CBC WITH DIFFERENTIAL/PLATELET
Abs Immature Granulocytes: 0.01 10*3/uL (ref 0.00–0.07)
Basophils Absolute: 0 10*3/uL (ref 0.0–0.1)
Basophils Relative: 1 %
Eosinophils Absolute: 0.2 10*3/uL (ref 0.0–0.5)
Eosinophils Relative: 6 %
HCT: 39 % (ref 36.0–46.0)
Hemoglobin: 12.4 g/dL (ref 12.0–15.0)
Immature Granulocytes: 0 %
Lymphocytes Relative: 43 %
Lymphs Abs: 1.8 10*3/uL (ref 0.7–4.0)
MCH: 24.6 pg — ABNORMAL LOW (ref 26.0–34.0)
MCHC: 31.8 g/dL (ref 30.0–36.0)
MCV: 77.2 fL — ABNORMAL LOW (ref 80.0–100.0)
Monocytes Absolute: 0.3 10*3/uL (ref 0.1–1.0)
Monocytes Relative: 8 %
Neutro Abs: 1.8 10*3/uL (ref 1.7–7.7)
Neutrophils Relative %: 42 %
Platelets: 208 10*3/uL (ref 150–400)
RBC: 5.05 MIL/uL (ref 3.87–5.11)
RDW: 13.9 % (ref 11.5–15.5)
WBC: 4.1 10*3/uL (ref 4.0–10.5)
nRBC: 0 % (ref 0.0–0.2)

## 2019-11-10 LAB — VITAMIN B12: Vitamin B-12: 369 pg/mL (ref 180–914)

## 2019-11-10 LAB — IRON AND TIBC
Iron: 95 ug/dL (ref 28–170)
Saturation Ratios: 30 % (ref 10.4–31.8)
TIBC: 316 ug/dL (ref 250–450)
UIBC: 221 ug/dL

## 2019-11-10 LAB — FERRITIN: Ferritin: 46 ng/mL (ref 11–307)

## 2019-11-12 ENCOUNTER — Inpatient Hospital Stay (HOSPITAL_BASED_OUTPATIENT_CLINIC_OR_DEPARTMENT_OTHER): Payer: Medicare Other | Admitting: Oncology

## 2019-11-12 DIAGNOSIS — D509 Iron deficiency anemia, unspecified: Secondary | ICD-10-CM

## 2019-11-12 DIAGNOSIS — R918 Other nonspecific abnormal finding of lung field: Secondary | ICD-10-CM | POA: Diagnosis not present

## 2019-11-12 DIAGNOSIS — Z85038 Personal history of other malignant neoplasm of large intestine: Secondary | ICD-10-CM

## 2019-11-12 DIAGNOSIS — E538 Deficiency of other specified B group vitamins: Secondary | ICD-10-CM | POA: Diagnosis not present

## 2019-11-15 NOTE — Progress Notes (Signed)
I connected with Joyce Pace on 11/15/19 at  2:00 PM EST by video enabled telemedicine visit and verified that I am speaking with the correct person using two identifiers.   I discussed the limitations, risks, security and privacy concerns of performing an evaluation and management service by telemedicine and the availability of in-person appointments. I also discussed with the patient that there may be a patient responsible charge related to this service. The patient expressed understanding and agreed to proceed.  Other persons participating in the visit and their role in the encounter:  none  Patient's location:  home Provider's location:  work  Risk analyst Complaint:  Discuss ct chest results and f/u of b12 deficiency H/o colon cancer  History of present illness: Patient is a 68 year old female with a prior history of colon cancer that was diagnosed in 80 in [place].This was stage IIa PT 3 PN 0 grade 2. Final pathology showed moderately differentiated adenocarcinoma extensively ulcerated with extension into muscularis propria and pericolonic adipose tissue. Maximum size of the tumor was 5 cm with significant luminal obstruction. Margins were -15 lymph nodes were negative for malignancy. Patient underwent 12 cycles of adjuvant FOLFOX in September 2014. She also subsequently had surveillance colonoscopy done and most recently was seen by Dr. Bonna Gains and underwent EGD and colonoscopy which was unremarkable. Colonoscopy in August 2019 showed a 5 mm sigmoid polyp which was negative for malignancy. Last CT scan was in September 2017 which showed no evidence of malignancy. Most recent CBC from 06/19/2018 showed white count of 6.3, H&H of 12.1/37.5 with an MCV of 75.8 and a platelet count of 168.   Interval history: she is doing well overall. Denies any complaints today   Review of Systems  Constitutional: Negative for chills, fever, malaise/fatigue and weight loss.  HENT: Negative  for congestion, ear discharge and nosebleeds.   Eyes: Negative for blurred vision.  Respiratory: Negative for cough, hemoptysis, sputum production, shortness of breath and wheezing.   Cardiovascular: Negative for chest pain, palpitations, orthopnea and claudication.  Gastrointestinal: Negative for abdominal pain, blood in stool, constipation, diarrhea, heartburn, melena, nausea and vomiting.  Genitourinary: Negative for dysuria, flank pain, frequency, hematuria and urgency.  Musculoskeletal: Negative for back pain, joint pain and myalgias.  Skin: Negative for rash.  Neurological: Negative for dizziness, tingling, focal weakness, seizures, weakness and headaches.  Endo/Heme/Allergies: Does not bruise/bleed easily.  Psychiatric/Behavioral: Negative for depression and suicidal ideas. The patient does not have insomnia.     No Known Allergies  Past Medical History:  Diagnosis Date  . Anxiety   . Colon cancer ([facility]) 2014  . Hypertension     Past Surgical History:  Procedure Laterality Date  . CHOLECYSTECTOMY    . COLON SURGERY  2016   colon resection  . COLONOSCOPY WITH PROPOFOL N/A 06/12/2018   Procedure: COLONOSCOPY WITH PROPOFOL;  Surgeon: Virgel Manifold, MD;  Location: [facility] ENDOSCOPY;  Service: Endoscopy;  Laterality: N/A;  . ESOPHAGOGASTRODUODENOSCOPY (EGD) WITH PROPOFOL N/A 06/12/2018   Procedure: ESOPHAGOGASTRODUODENOSCOPY (EGD) WITH PROPOFOL;  Surgeon: Virgel Manifold, MD;  Location: [facility] ENDOSCOPY;  Service: Endoscopy;  Laterality: N/A;    Social History   Socioeconomic History  . Marital status: Married    Spouse name: Not on file  . Number of children: Not on file  . Years of education: Not on file  . Highest education level: High school graduate  Occupational History  . Occupation: retired  Tobacco Use  . Smoking status: Never Smoker  . Smokeless tobacco:  Never Used  Substance and Sexual Activity  . Alcohol use: No  . Drug use: No  . Sexual activity: Not  Currently  Other Topics Concern  . Not on file  Social History Narrative  . Not on file   Social Determinants of Health   Financial Resource Strain:   . Difficulty of Paying Living Expenses: Not on file  Food Insecurity:   . Worried About Charity fundraiser in the Last Year: Not on file  . Ran Out of Food in the Last Year: Not on file  Transportation Needs:   . Lack of Transportation (Medical): Not on file  . Lack of Transportation (Non-Medical): Not on file  Physical Activity: Unknown  . Days of Exercise per Week: 7 days  . Minutes of Exercise per Session: Not on file  Stress:   . Feeling of Stress : Not on file  Social Connections:   . Frequency of Communication with Friends and Family: Not on file  . Frequency of Social Gatherings with Friends and Family: Not on file  . Attends Religious Services: Not on file  . Active Member of Clubs or Organizations: Not on file  . Attends Archivist Meetings: Not on file  . Marital Status: Not on file  Intimate Partner Violence:   . Fear of Current or Ex-Partner: Not on file  . Emotionally Abused: Not on file  . Physically Abused: Not on file  . Sexually Abused: Not on file    Family History  Problem Relation Age of Onset  . Hyperlipidemia Mother   . Hypertension Mother   . Diabetes Father   . Hypertension Sister   . Hyperlipidemia Sister   . Hyperlipidemia Brother   . Hypertension Brother   . Ovarian cancer Daughter   . Healthy Son   . Heart disease Maternal Grandmother   . Heart disease Maternal Grandfather   . Heart disease Paternal Grandmother   . Heart disease Paternal Grandfather   . Hyperlipidemia Sister   . Healthy Daughter      Current Outpatient Medications:  .  aspirin EC 81 MG tablet, Take 1 tablet (81 mg total) by mouth daily., Disp: , Rfl:  .  hydrochlorothiazide (HYDRODIURIL) 12.5 MG tablet, Take 1 tablet (12.5 mg total) by mouth daily., Disp: 90 tablet, Rfl: 1 .  lisinopril (ZESTRIL) 20 MG  tablet, Take 1 tablet (20 mg total) by mouth daily., Disp: 90 tablet, Rfl: 1 .  omeprazole (PRILOSEC) 20 MG capsule, TAKE 1 CAPSULE(20 MG) BY MOUTH DAILY, Disp: 90 capsule, Rfl: 0 .  PARoxetine (PAXIL) 10 MG tablet, Take 1 tablet (10 mg total) by mouth daily., Disp: 90 tablet, Rfl: 1 .  simvastatin (ZOCOR) 40 MG tablet, Take 1 tablet (40 mg total) by mouth at bedtime., Disp: 90 tablet, Rfl: 3  No results found.  No images are attached to the encounter.   CMP Latest Ref Rng & Units 07/02/2019  Glucose 65 - 99 mg/dL 104(H)  BUN 7 - 25 mg/dL 18  Creatinine 0.50 - 0.99 mg/dL 0.86  Sodium 135 - 146 mmol/L 140  Potassium 3.5 - 5.3 mmol/L 4.5  Chloride 98 - 110 mmol/L 104  CO2 20 - 32 mmol/L 30  Calcium 8.6 - 10.4 mg/dL 9.5  Total Protein 6.1 - 8.1 g/dL 6.8  Total Bilirubin 0.2 - 1.2 mg/dL 1.3(H)  Alkaline Phos 38 - 126 U/L -  AST 10 - 35 U/L 17  ALT 6 - 29 U/L 13   CBC  Latest Ref Rng & Units 11/10/2019  WBC 4.0 - 10.5 K/uL 4.1  Hemoglobin 12.0 - 15.0 g/dL 12.4  Hematocrit 36.0 - 46.0 % 39.0  Platelets 150 - 400 K/uL 208     Observation/objective: appears in no acute distress over video visit today. Breathing is non labored  Assessment and plan: Patient is a 69 yr old female with following issues:  1. Microcytosis without anemia: cause of microcytosis unclear. Hb electropheresis normal. Iron studies are normal. Continue to monitor  2. B12 deficiency- she is taking 500 mcg daily and I have asked her to take 1000 mcg daily. Levels are presently normal. conitnue to monitor  3. Lung nodules- b/l subcm stable over 1 year. No further scans needed.  4. H/o colon cancer in 2014- no need for surveillance at this time as it has been > 5 years  Follow-up instructions: labs followed by video visit in 6 months  I discussed the assessment and treatment plan with the patient. The patient was provided an opportunity to ask questions and all were answered. The patient agreed with the plan and  demonstrated an understanding of the instructions.   The patient was advised to call back or seek an in-person evaluation if the symptoms worsen or if the condition fails to improve as anticipated.   Visit Diagnosis: 1. B12 deficiency   2. Microcytic anemia   3. Lung nodules   4. History of colon cancer     Dr. Randa Evens, MD, MPH [facility] [phone] 11/15/2019 5:19 PM

## 2020-03-22 ENCOUNTER — Other Ambulatory Visit: Payer: Self-pay

## 2020-03-22 ENCOUNTER — Inpatient Hospital Stay (HOSPITAL_BASED_OUTPATIENT_CLINIC_OR_DEPARTMENT_OTHER): Payer: Medicare Other | Admitting: Oncology

## 2020-03-22 ENCOUNTER — Encounter: Payer: Self-pay | Admitting: Oncology

## 2020-03-22 ENCOUNTER — Inpatient Hospital Stay: Payer: Medicare Other | Attending: Oncology

## 2020-03-22 VITALS — BP 151/75 | HR 70 | Temp 98.9°F | Resp 18 | Wt 177.3 lb

## 2020-03-22 DIAGNOSIS — Z79899 Other long term (current) drug therapy: Secondary | ICD-10-CM | POA: Insufficient documentation

## 2020-03-22 DIAGNOSIS — Z8249 Family history of ischemic heart disease and other diseases of the circulatory system: Secondary | ICD-10-CM | POA: Diagnosis not present

## 2020-03-22 DIAGNOSIS — Z7982 Long term (current) use of aspirin: Secondary | ICD-10-CM | POA: Insufficient documentation

## 2020-03-22 DIAGNOSIS — Z8042 Family history of malignant neoplasm of prostate: Secondary | ICD-10-CM | POA: Insufficient documentation

## 2020-03-22 DIAGNOSIS — Z833 Family history of diabetes mellitus: Secondary | ICD-10-CM | POA: Diagnosis not present

## 2020-03-22 DIAGNOSIS — Z85038 Personal history of other malignant neoplasm of large intestine: Secondary | ICD-10-CM | POA: Diagnosis not present

## 2020-03-22 DIAGNOSIS — E538 Deficiency of other specified B group vitamins: Secondary | ICD-10-CM

## 2020-03-22 DIAGNOSIS — F419 Anxiety disorder, unspecified: Secondary | ICD-10-CM | POA: Diagnosis not present

## 2020-03-22 DIAGNOSIS — Z8349 Family history of other endocrine, nutritional and metabolic diseases: Secondary | ICD-10-CM | POA: Insufficient documentation

## 2020-03-22 DIAGNOSIS — D509 Iron deficiency anemia, unspecified: Secondary | ICD-10-CM | POA: Diagnosis not present

## 2020-03-22 DIAGNOSIS — I10 Essential (primary) hypertension: Secondary | ICD-10-CM | POA: Diagnosis not present

## 2020-03-22 LAB — CBC WITH DIFFERENTIAL/PLATELET
Abs Immature Granulocytes: 0.01 10*3/uL (ref 0.00–0.07)
Basophils Absolute: 0 10*3/uL (ref 0.0–0.1)
Basophils Relative: 1 %
Eosinophils Absolute: 0.4 10*3/uL (ref 0.0–0.5)
Eosinophils Relative: 8 %
HCT: 41.3 % (ref 36.0–46.0)
Hemoglobin: 13 g/dL (ref 12.0–15.0)
Immature Granulocytes: 0 %
Lymphocytes Relative: 37 %
Lymphs Abs: 1.6 10*3/uL (ref 0.7–4.0)
MCH: 24.3 pg — ABNORMAL LOW (ref 26.0–34.0)
MCHC: 31.5 g/dL (ref 30.0–36.0)
MCV: 77.1 fL — ABNORMAL LOW (ref 80.0–100.0)
Monocytes Absolute: 0.4 10*3/uL (ref 0.1–1.0)
Monocytes Relative: 8 %
Neutro Abs: 2.1 10*3/uL (ref 1.7–7.7)
Neutrophils Relative %: 46 %
Platelets: 215 10*3/uL (ref 150–400)
RBC: 5.36 MIL/uL — ABNORMAL HIGH (ref 3.87–5.11)
RDW: 13.8 % (ref 11.5–15.5)
WBC: 4.4 10*3/uL (ref 4.0–10.5)
nRBC: 0 % (ref 0.0–0.2)

## 2020-03-22 LAB — FERRITIN: Ferritin: 44 ng/mL (ref 11–307)

## 2020-03-22 LAB — IRON AND TIBC
Iron: 122 ug/dL (ref 28–170)
Saturation Ratios: 34 % — ABNORMAL HIGH (ref 10.4–31.8)
TIBC: 356 ug/dL (ref 250–450)
UIBC: 234 ug/dL

## 2020-03-22 LAB — VITAMIN B12: Vitamin B-12: 330 pg/mL (ref 180–914)

## 2020-03-22 NOTE — Progress Notes (Signed)
Patient here for oncology follow-up appointment, expresses concerns of anxiety and "feelings of dark or passing out sensations at times".

## 2020-03-24 NOTE — Progress Notes (Signed)
Hematology/Oncology Consult note [facility]  Telephone:([phone] [phone]  Patient Care Team: Mikey College, NP (Inactive) as PCP - General (Nurse Practitioner) Virgel Manifold, MD as Consulting Physician (Gastroenterology)   Name of the patient: Joyce Pace  SV:[id number]  03/14/1951   Date of visit: 03/24/20  Diagnosis- 1. H/o colon cancer 2. Microcytosis without anemia 3. b12 deficiency  Chief complaint/ Reason for visit-routine follow-up of anemia  Heme/Onc history: Patient is a 70 year old female with a prior history of colon cancer that was diagnosed in 74 in [place].This was stage IIa PT 3 PN 0 grade 2. Final pathology showed moderately differentiated adenocarcinoma extensively ulcerated with extension into muscularis propria and pericolonic adipose tissue. Maximum size of the tumor was 5 cm with significant luminal obstruction. Margins were -15 lymph nodes were negative for malignancy. Patient underwent 12 cycles of adjuvant FOLFOX in September 2014. She also subsequently had surveillance colonoscopy done and most recently was seen by Dr. Bonna Gains and underwent EGD and colonoscopy which was unremarkable. Colonoscopy in August 2019 showed a 5 mm sigmoid polyp which was negative for malignancy. Last CT scan was in September 2017 which showed no evidence of malignancy. Most recent CBC from 06/19/2018 showed white count of 6.3, H&H of 12.1/37.5 with an MCV of 75.8 and a platelet count of 168.   Interval history-patient has been having significant personal stressors.  Her daughter has ovarian cancer and is going to treatments in [place].  Also her husband recently started dialysis and requires significant care.  Patient reports feelings of anxiety and that at times when she feels she cannot breathe when she has increased anxiety.  Episodes last for less than a minute and subside on its own.  She denies feeling  depressed.  Denies any suicidal or homicidal ideation  ECOG PS- 1 Pain scale- 0   Review of systems- Review of Systems  Constitutional: Negative for chills, fever, malaise/fatigue and weight loss.  HENT: Negative for congestion, ear discharge and nosebleeds.   Eyes: Negative for blurred vision.  Respiratory: Negative for cough, hemoptysis, sputum production, shortness of breath and wheezing.   Cardiovascular: Negative for chest pain, palpitations, orthopnea and claudication.  Gastrointestinal: Negative for abdominal pain, blood in stool, constipation, diarrhea, heartburn, melena, nausea and vomiting.  Genitourinary: Negative for dysuria, flank pain, frequency, hematuria and urgency.  Musculoskeletal: Negative for back pain, joint pain and myalgias.  Skin: Negative for rash.  Neurological: Negative for dizziness, tingling, focal weakness, seizures, weakness and headaches.  Endo/Heme/Allergies: Does not bruise/bleed easily.  Psychiatric/Behavioral: Negative for depression and suicidal ideas. The patient is nervous/anxious. The patient does not have insomnia.       No Known Allergies   Past Medical History:  Diagnosis Date  . Anxiety   . Colon cancer ([place]) 2014  . Hypertension      Past Surgical History:  Procedure Laterality Date  . CHOLECYSTECTOMY    . COLON SURGERY  2016   colon resection  . COLONOSCOPY WITH PROPOFOL N/A 06/12/2018   Procedure: COLONOSCOPY WITH PROPOFOL;  Surgeon: Virgel Manifold, MD;  Location: [facility] ENDOSCOPY;  Service: Endoscopy;  Laterality: N/A;  . ESOPHAGOGASTRODUODENOSCOPY (EGD) WITH PROPOFOL N/A 06/12/2018   Procedure: ESOPHAGOGASTRODUODENOSCOPY (EGD) WITH PROPOFOL;  Surgeon: Virgel Manifold, MD;  Location: [facility] ENDOSCOPY;  Service: Endoscopy;  Laterality: N/A;    Social History   Socioeconomic History  . Marital status: Married    Spouse name: Not on file  . Number of children:  Not on file  . Years of education: Not on file  . Highest  education level: High school graduate  Occupational History  . Occupation: retired  Tobacco Use  . Smoking status: Never Smoker  . Smokeless tobacco: Never Used  Substance and Sexual Activity  . Alcohol use: No  . Drug use: No  . Sexual activity: Not Currently  Other Topics Concern  . Not on file  Social History Narrative  . Not on file   Social Determinants of Health   Financial Resource Strain:   . Difficulty of Paying Living Expenses:   Food Insecurity:   . Worried About Charity fundraiser in the Last Year:   . Arboriculturist in the Last Year:   Transportation Needs:   . Film/video editor (Medical):   Marland Kitchen Lack of Transportation (Non-Medical):   Physical Activity: Unknown  . Days of Exercise per Week: 7 days  . Minutes of Exercise per Session: Not on file  Stress:   . Feeling of Stress :   Social Connections:   . Frequency of Communication with Friends and Family:   . Frequency of Social Gatherings with Friends and Family:   . Attends Religious Services:   . Active Member of Clubs or Organizations:   . Attends Archivist Meetings:   Marland Kitchen Marital Status:   Intimate Partner Violence:   . Fear of Current or Ex-Partner:   . Emotionally Abused:   Marland Kitchen Physically Abused:   . Sexually Abused:     Family History  Problem Relation Age of Onset  . Hyperlipidemia Mother   . Hypertension Mother   . Diabetes Father   . Hypertension Sister   . Hyperlipidemia Sister   . Hyperlipidemia Brother   . Hypertension Brother   . Ovarian cancer Daughter   . Healthy Son   . Heart disease Maternal Grandmother   . Heart disease Maternal Grandfather   . Heart disease Paternal Grandmother   . Heart disease Paternal Grandfather   . Hyperlipidemia Sister   . Healthy Daughter      Current Outpatient Medications:  .  lisinopril (ZESTRIL) 20 MG tablet, Take 1 tablet (20 mg total) by mouth daily., Disp: 90 tablet, Rfl: 1 .  PARoxetine (PAXIL) 10 MG tablet, Take 1 tablet (10  mg total) by mouth daily., Disp: 90 tablet, Rfl: 1 .  simvastatin (ZOCOR) 40 MG tablet, Take 1 tablet (40 mg total) by mouth at bedtime., Disp: 90 tablet, Rfl: 3 .  aspirin EC 81 MG tablet, Take 1 tablet (81 mg total) by mouth daily. (Patient not taking: Reported on 03/22/2020), Disp: , Rfl:  .  hydrochlorothiazide (HYDRODIURIL) 12.5 MG tablet, Take 1 tablet (12.5 mg total) by mouth daily. (Patient not taking: Reported on 03/22/2020), Disp: 90 tablet, Rfl: 1 .  omeprazole (PRILOSEC) 20 MG capsule, TAKE 1 CAPSULE(20 MG) BY MOUTH DAILY (Patient not taking: Reported on 03/22/2020), Disp: 90 capsule, Rfl: 0  Physical exam:  Vitals:   03/22/20 1035  BP: (!) 151/75  Pulse: 70  Resp: 18  Temp: 98.9 F (37.2 C)  TempSrc: Oral  SpO2: 100%  Weight: 177 lb 4.8 oz (80.4 kg)   Physical Exam Constitutional:      General: She is not in acute distress. Cardiovascular:     Rate and Rhythm: Normal rate and regular rhythm.     Heart sounds: Normal heart sounds.  Pulmonary:     Effort: Pulmonary effort is normal.  Breath sounds: Normal breath sounds.  Abdominal:     General: Bowel sounds are normal.     Palpations: Abdomen is soft.  Skin:    General: Skin is warm and dry.  Neurological:     Mental Status: She is alert and oriented to person, place, and time.      CMP Latest Ref Rng & Units 07/02/2019  Glucose 65 - 99 mg/dL 104(H)  BUN 7 - 25 mg/dL 18  Creatinine 0.50 - 0.99 mg/dL 0.86  Sodium 135 - 146 mmol/L 140  Potassium 3.5 - 5.3 mmol/L 4.5  Chloride 98 - 110 mmol/L 104  CO2 20 - 32 mmol/L 30  Calcium 8.6 - 10.4 mg/dL 9.5  Total Protein 6.1 - 8.1 g/dL 6.8  Total Bilirubin 0.2 - 1.2 mg/dL 1.3(H)  Alkaline Phos 38 - 126 U/L -  AST 10 - 35 U/L 17  ALT 6 - 29 U/L 13   CBC Latest Ref Rng & Units 03/22/2020  WBC 4.0 - 10.5 K/uL 4.4  Hemoglobin 12.0 - 15.0 g/dL 13.0  Hematocrit 36.0 - 46.0 % 41.3  Platelets 150 - 400 K/uL 215      Assessment and plan- Patient is a 69 y.o. female  with h/o stage II colon cancer in 2014. She is here for routine follow-up of anemia  1. Microcytic anemia:Patient has had longstanding microcytosis without overt anemia hemoglobin has been stable is obtained today to 13. Iron studies are presently normal and B12 level is in normal at 330. Continue to monitor  2. Anxiety: Patient does have personal stressors which is contributing to her anxiety. She was on anxiety medications in the past but is not currently taking anything. I have asked her to speak to her primary care provider Joyce Pace to see if she needs to be started on the medication since it seems to be getting worse.  3. Patient wanted to get a CA-125 checked today. I explained to this patient that CA-125 is a tumor marker for GYN malignancy which the patient does not have. She has had colon cancer over 5 years ago and does not require any surveillance scans or tumor markers unless she is symptomatic. She does not have any significant abdominal pain or changes in her bowel habits. No blood in her stool. No concerning signs and symptoms of recurrence based on today's exam.  I will see her back in 4 months. CBC ferritin and iron studies and B12   Visit Diagnosis 1. Microcytic anemia   2. History of colon cancer      Dr. Randa Evens, MD, MPH [facility] at [facility] [id number] 03/24/2020 9:22 AM

## 2020-04-13 ENCOUNTER — Encounter: Payer: Medicare Other | Admitting: Family Medicine

## 2020-04-29 ENCOUNTER — Other Ambulatory Visit: Payer: Self-pay | Admitting: Nurse Practitioner

## 2020-04-29 DIAGNOSIS — I10 Essential (primary) hypertension: Secondary | ICD-10-CM

## 2020-04-29 DIAGNOSIS — E782 Mixed hyperlipidemia: Secondary | ICD-10-CM

## 2020-04-29 NOTE — Telephone Encounter (Signed)
Courtesy refill. Left pt. Message to call and make an appointment.

## 2020-04-30 ENCOUNTER — Other Ambulatory Visit: Payer: Self-pay | Admitting: Family Medicine

## 2020-04-30 DIAGNOSIS — I10 Essential (primary) hypertension: Secondary | ICD-10-CM

## 2020-04-30 NOTE — Telephone Encounter (Signed)
Requested Prescriptions  Pending Prescriptions Disp Refills   simvastatin (ZOCOR) 40 MG tablet [Pharmacy Med Name: SIMVASTATIN 40MG  TABLETS] 90 tablet 3    Sig: TAKE 1 TABLET(40 MG) BY MOUTH AT BEDTIME     Cardiovascular:  Antilipid - Statins Failed - 04/29/2020  9:27 PM      Failed - HDL in normal range and within 360 days    HDL  Date Value Ref Range Status  07/02/2019 49 (L) > OR = 50 mg/dL Final         Failed - Triglycerides in normal range and within 360 days    Triglycerides  Date Value Ref Range Status  07/02/2019 179 (H) <150 mg/dL Final         Passed - Total Cholesterol in normal range and within 360 days    Cholesterol  Date Value Ref Range Status  07/02/2019 166 <200 mg/dL Final         Passed - LDL in normal range and within 360 days    LDL Cholesterol (Calc)  Date Value Ref Range Status  07/02/2019 89 mg/dL (calc) Final    Comment:    Reference range: <100 . Desirable range <100 mg/dL for primary prevention;   <70 mg/dL for patients with CHD or diabetic patients  with > or = 2 CHD risk factors. Marland Kitchen LDL-C is now calculated using the Martin-Hopkins  calculation, which is a validated novel method providing  better accuracy than the Friedewald equation in the  estimation of LDL-C.  Cresenciano Genre et al. Annamaria Helling. 2725;366(44): 2061-2068  (http://education.[employer].com/faq/FAQ164)          Passed - Patient is not pregnant      Passed - Valid encounter within last 12 months    Recent Outpatient Visits          10 months ago Essential hypertension   [facility] Mikey College, NP   1 year ago Anxiety and depression   [facility] Merrilyn Puma, Jerrel Ivory, NP   1 year ago Welcome to [employer] preventive visit   [facility] Merrilyn Puma, Jerrel Ivory, NP   2 years ago Gastroesophageal reflux disease, esophagitis presence not specified   [facility] Mikey College, NP   2 years ago  Epigastric pain   [facility] Mikey College, NP

## 2020-05-05 ENCOUNTER — Encounter: Payer: Medicare Other | Admitting: Family Medicine

## 2020-05-10 ENCOUNTER — Encounter: Payer: Medicare Other | Admitting: Family Medicine

## 2020-05-11 ENCOUNTER — Other Ambulatory Visit: Payer: Medicare Other

## 2020-05-12 ENCOUNTER — Telehealth: Payer: Medicare Other | Admitting: Oncology

## 2020-05-29 ENCOUNTER — Other Ambulatory Visit: Payer: Self-pay | Admitting: Family Medicine

## 2020-05-29 DIAGNOSIS — I10 Essential (primary) hypertension: Secondary | ICD-10-CM

## 2020-05-29 NOTE — Telephone Encounter (Signed)
Requested medication (s) are due for refill today: yes  Requested medication (s) are on the active medication list: yes  Last refill:  04/29/20  Future visit scheduled: no  Notes to clinic:  called pt and LM on VM to call office to make appt.    Requested Prescriptions  Pending Prescriptions Disp Refills   lisinopril (ZESTRIL) 20 MG tablet [Pharmacy Med Name: LISINOPRIL 20MG  TABLETS] 30 tablet 0    Sig: TAKE 1 TABLET(20 MG) BY MOUTH DAILY      Cardiovascular:  ACE Inhibitors Failed - 05/29/2020  7:56 AM      Failed - Cr in normal range and within 180 days    Creat  Date Value Ref Range Status  07/02/2019 0.86 0.50 - 0.99 mg/dL Final    Comment:    For patients >86 years of age, the reference limit for Creatinine is approximately 13% higher for people identified as African-American. .           Failed - K in normal range and within 180 days    Potassium  Date Value Ref Range Status  07/02/2019 4.5 3.5 - 5.3 mmol/L Final          Failed - Last BP in normal range    BP Readings from Last 1 Encounters:  03/22/20 (!) 151/75          Failed - Valid encounter within last 6 months    Recent Outpatient Visits           11 months ago Essential hypertension   [facility] Mikey College, NP   1 year ago Anxiety and depression   [facility] Merrilyn Puma, Jerrel Ivory, NP   1 year ago [facility] preventive visit   [facility] Merrilyn Puma, Jerrel Ivory, NP   2 years ago Gastroesophageal reflux disease, esophagitis presence not specified   [facility] Mikey College, NP   2 years ago Epigastric pain   [facility] Mikey College, NP              Passed - Patient is not pregnant

## 2020-05-30 ENCOUNTER — Other Ambulatory Visit: Payer: Self-pay | Admitting: Family Medicine

## 2020-05-30 DIAGNOSIS — I10 Essential (primary) hypertension: Secondary | ICD-10-CM

## 2020-05-30 NOTE — Telephone Encounter (Signed)
Patient will be discharged from clinic due to recent no shows and same day cancellations.  Engineer, building services working on letter out to patient.  Had received one time 30 day courtesy refill on 04/29/2020

## 2020-05-30 NOTE — Telephone Encounter (Signed)
Requested medication (s) are due for refill today:  Yes  Requested medication (s) are on the active medication list:  Yes  Future visit scheduled:  No  Last Refill: 04/29/20; #30; no refills  Note to Clinic:  It was noted that refill was refused recently, due to pt. no longer under provider's care.  No documentation is evident that pt. has changed her PCP.  Attempted to call pt. to verify if she changed PCPs.  Left vm to call office, to verify if she has changed PCP.  Has had one courtesy refill.   Requested Prescriptions  Pending Prescriptions Disp Refills   lisinopril (ZESTRIL) 20 MG tablet [Pharmacy Med Name: LISINOPRIL 20MG  TABLETS] 30 tablet 0    Sig: TAKE 1 TABLET(20 MG) BY MOUTH DAILY      Cardiovascular:  ACE Inhibitors Failed - 05/30/2020  2:08 PM      Failed - Cr in normal range and within 180 days    Creat  Date Value Ref Range Status  07/02/2019 0.86 0.50 - 0.99 mg/dL Final    Comment:    For patients >67 years of age, the reference limit for Creatinine is approximately 13% higher for people identified as African-American. .           Failed - K in normal range and within 180 days    Potassium  Date Value Ref Range Status  07/02/2019 4.5 3.5 - 5.3 mmol/L Final          Failed - Last BP in normal range    BP Readings from Last 1 Encounters:  03/22/20 (!) 151/75          Failed - Valid encounter within last 6 months    Recent Outpatient Visits           11 months ago Essential hypertension   [facility] Mikey College, NP   1 year ago Anxiety and depression   [facility] Merrilyn Puma, Jerrel Ivory, NP   1 year ago [facility] preventive visit   [facility] Merrilyn Puma, Jerrel Ivory, NP   2 years ago Gastroesophageal reflux disease, esophagitis presence not specified   [facility] Mikey College, NP   2 years ago Epigastric pain   [facility] Mikey College, NP              Passed - Patient is not pregnant

## 2020-07-01 ENCOUNTER — Other Ambulatory Visit: Payer: Self-pay

## 2020-07-01 ENCOUNTER — Encounter: Payer: Self-pay | Admitting: Family Medicine

## 2020-07-01 ENCOUNTER — Ambulatory Visit (INDEPENDENT_AMBULATORY_CARE_PROVIDER_SITE_OTHER): Payer: Medicare Other | Admitting: Family Medicine

## 2020-07-01 ENCOUNTER — Telehealth: Payer: Self-pay

## 2020-07-01 VITALS — BP 133/68 | HR 67 | Temp 98.1°F | Resp 18 | Ht 65.0 in | Wt 143.2 lb

## 2020-07-01 DIAGNOSIS — F419 Anxiety disorder, unspecified: Secondary | ICD-10-CM | POA: Diagnosis not present

## 2020-07-01 DIAGNOSIS — I10 Essential (primary) hypertension: Secondary | ICD-10-CM

## 2020-07-01 DIAGNOSIS — E782 Mixed hyperlipidemia: Secondary | ICD-10-CM | POA: Diagnosis not present

## 2020-07-01 DIAGNOSIS — F4329 Adjustment disorder with other symptoms: Secondary | ICD-10-CM

## 2020-07-01 DIAGNOSIS — Z1231 Encounter for screening mammogram for malignant neoplasm of breast: Secondary | ICD-10-CM

## 2020-07-01 DIAGNOSIS — Z0000 Encounter for general adult medical examination without abnormal findings: Secondary | ICD-10-CM | POA: Diagnosis not present

## 2020-07-01 DIAGNOSIS — F4321 Adjustment disorder with depressed mood: Secondary | ICD-10-CM

## 2020-07-01 DIAGNOSIS — F329 Major depressive disorder, single episode, unspecified: Secondary | ICD-10-CM

## 2020-07-01 DIAGNOSIS — Z13820 Encounter for screening for osteoporosis: Secondary | ICD-10-CM

## 2020-07-01 DIAGNOSIS — R634 Abnormal weight loss: Secondary | ICD-10-CM

## 2020-07-01 DIAGNOSIS — R3129 Other microscopic hematuria: Secondary | ICD-10-CM | POA: Insufficient documentation

## 2020-07-01 LAB — POCT URINALYSIS DIPSTICK
Bilirubin, UA: NEGATIVE
Glucose, UA: NEGATIVE
Ketones, UA: NEGATIVE
Leukocytes, UA: NEGATIVE
Nitrite, UA: NEGATIVE
Protein, UA: NEGATIVE
Spec Grav, UA: 1.015 (ref 1.010–1.025)
Urobilinogen, UA: 0.2 E.U./dL
pH, UA: 5 (ref 5.0–8.0)

## 2020-07-01 MED ORDER — PAROXETINE HCL 10 MG PO TABS: 10.0000 mg | ORAL_TABLET | Freq: Every day | ORAL | 1 refills | Status: DC

## 2020-07-01 MED ORDER — SIMVASTATIN 40 MG PO TABS: ORAL_TABLET | ORAL | 3 refills | Status: DC

## 2020-07-01 MED ORDER — LISINOPRIL 20 MG PO TABS: ORAL_TABLET | ORAL | 1 refills | Status: DC

## 2020-07-01 NOTE — Assessment & Plan Note (Signed)
Stable and well controlled based on labs from 06/2019, will have repeat labs drawn.  Currently taking simvastatn 40mg  and tolerating it well.  Reviewed common side effects of myalgia (reversible off med), less common side effects of cognitive impairment (reversible off med), increased glucose, rhabdomyolysis.  Plan: 1) Labs ordered for today 2) Continue simvastatin 40mg  daily  3) Heart healthy diet and to exercise every other day for 30 minutes per day, going no more than 2 days in a row without exercise. 4) We will see you back in 6 months

## 2020-07-01 NOTE — Patient Instructions (Addendum)
I have put in a referral to chronic care management for assistance with local community support resources.  You should hear from someone within their office in the next week or so.  Have your labs drawn and we will contact you with the results.  For Mammogram screening for breast cancer and DEXA Scan (Bone mineral density) screening for osteoporosis  Call the [facility] below anytime to schedule your own appointment now that order has been placed.  [facility] [place], [place] [address] Phone: [phone]  [facility] [address] [place], [place] [address] Phone: [phone]  Your medication refills have been sent to your pharmacy on file.  The following recommendations are helpful adjuncts for helping rebalance your mood.  Eat a nourishing diet. Ensure adequate intake of calories, protein, carbs, fat, vitamins, and minerals. Prioritize whole foods at each meal, including meats, vegetables, fruits, nuts and seeds, etc.   Avoid inflammatory and/or "junk" foods, such as sugar, omega-6 fats, refined grains, chemicals, and preservatives are common in packaged and prepared foods. Minimize or completely avoid these ingredients and stick to whole foods with little to no additives. Cook from scratch as much as possible for more control over what you eat  Get enough sleep. Poor sleep is significantly associated with depression and anxiety. Make 7-9 hours of sleep nightly a top priority  Exercise appropriately. Exercise is known to improve brain functioning and boost mood. Aim for 30 minutes of daily physical activity. Avoid "overtraining," which can cause mental disturbances  Assess your light exposure. Not enough natural light during the day and too much artificial light can have a major impact on your mood. Get outside as often as possible during daylight hours. Minimize light exposure after dark and  avoid the use of electronics that give off blue light before bed  Manage your stress.  Use daily stress management techniques such as meditation, yoga, or mindfulness to retrain your brain to respond differently to stress. Try deep breathing to deactivate your "fight or flight" response.  There are many of sources with apps like Headspace, Calm or a variety of YouTube videos (videos from Gwynne Edinger have guided meditation)  Prioritize your social life. Work on building social support with new friends or improve current relationships. Consider getting a pet that allows for companionship, social interaction, and physical touch. Try volunteering or joining a faith-based community to increase your sense of purpose  4-7-8 breathing technique at bedtime: breathe in to count of 4, hold breath for count of 7, exhale for count of 8; do 3-5 times for letting go of overactive thoughts  Take time to play Unstructured "play" time can help reduce anxiety and depression Options for play include music, games, sports, dance, art, etc.  Try to add daily omega 3 fatty acids, magnesium, B complex, and balanced amino acid supplements to help improve mood and anxiety.  Sleep hygiene is the single most effective treatment for sleep issues, but it is hard work.  Tips for a good night's sleep:  -Keep sleep environment comfortable and conducive to sleep -Keep regular sleep schedule 7 nights a week -Avoiding naps during the day -Avoiding going to bed until drowsy and ready to sleep, not trying to sleep, and not watching the clock -Get out of bed if not asleep within 15-20 minutes and returning only when drowsy -Avoiding caffeine, nicotine, alcohol, and other substances that interfere with sleep before bedtime -Take an hour before  your set bedtime and start to wind down: bath/shower, no more TV or phone (the blue light can interfere with sleeping), listen to soothing music, or meditation -No TV in your  bedroom -Exercising regularly, at least 6 hours before sleep. Yoga and Tai Chi can improve sleep quality  There are a lot of books and apps that may help guide you with any of the following:   -Progressive muscle relaxation (involves methodical tension and relaxation of different Muscle groups throughout body)  Guided imagery  -YouTube - Gwynne Edinger has free videos on YouTube that can help with meditation and some   Abdominal breathing   Over the counter sleep aid one hour before bed- and gradually wean your use over 2-4 weeks  Some examples are : *Melatonin 5-10 mg *Sleepology (Can find on [employer]) taken according to packaging directions  There are a few online evidence based online programs, unfortunately they are not free.   Developed by a sleep expert who created a drug-free program for insomnia proven more effective than sleeping pills.  www.cbtforinsomnia.com Sleepio is an evidence-based digital sleep improvement program   www.sleepio.com SHUTi is designed to actively help retrain your body and mind for great sleep through six engaging Cognitive Behavioral Therapy for Insomnia strategy and learning sessions  [email or web address]  We will plan to see you back in 6 months for hypertension and depression follow up visit  You will receive a survey after today's visit either digitally by e-mail or paper by [place] mail. Your experiences and feedback matter to [place].  Please respond so we know how we are doing as we provide care for you.  Call us with any questions/concerns/needs.  It is my goal to be available to you for your health concerns.  Thanks for choosing me to be a partner in your healthcare needs!  Harlin Rain, FNP-C Family Nurse Practitioner [facility] Phone: [phone]

## 2020-07-01 NOTE — Assessment & Plan Note (Signed)
Controlled hypertension.  Pt is working on lifestyle modifications.  Taking medications tolerating well without side effects.  Complications: GERD, HLD  Plan: 1. Continue taking lisinopril 20mg  daily 2. Obtain labs today  3. Encouraged heart healthy diet and increasing exercise to 30 minutes most days of the week, going no more than 2 days in a row without exercise. 4. Check BP 1-2 x per week at home, keep log, and bring to clinic at next appointment. 5. Follow up 6 months.

## 2020-07-01 NOTE — Chronic Care Management (AMB) (Signed)
  Chronic Care Management   Outreach Note  07/01/2020 Name: Delani Kohli MRN: [id number] DOB: 26-Mar-1951  Dasja Brase is a 69 y.o. year old female who is a primary care patient of Lorine Bears, Lupita Raider, FNP. I reached out to Mariane Duval by phone today in response to a referral sent by Ms. Anderson Malta Goin's PCP, Cyndia Skeeters, FNP     An unsuccessful telephone outreach was attempted today. The patient was referred to the case management team for assistance with care management and care coordination.   Follow Up Plan: A HIPPA compliant phone message was left for the patient providing contact information and requesting a return call.  The care management team will reach out to the patient again over the next 5 days.  If patient returns call to provider office, please advise to call [facility] at [place], [place], [place], [place] [address] Direct Dial: [phone] [email or web address] Website: [email or web address]

## 2020-07-01 NOTE — Assessment & Plan Note (Signed)
Annual physical exam without new findings.  Well adult with no acute concerns.  Plan: 1. Obtain health maintenance screenings as above according to age. - Increase physical activity to 30 minutes most days of the week.  - Eat healthy diet high in vegetables and fruits; low in refined carbohydrates. - Screening labs and tests as ordered 2. Return 1 year for annual physical.  

## 2020-07-01 NOTE — Assessment & Plan Note (Signed)
Pt last mammogram not on file, unsure when completed last.   Plan: 1. Screening mammogram order placed.  Pt will call to schedule appointment.  Information given.

## 2020-07-01 NOTE — Assessment & Plan Note (Signed)
Pt postmenopausal w/out history of prior DEXA scan.    Plan: 1. Obtain DG bone density.

## 2020-07-01 NOTE — Assessment & Plan Note (Signed)
PHQ9-3/GAD7-0.  Currently taking paroxetine 10mg  daily.  Reports sometimes will take every other day with continued good relief of symptoms.  Has had some increased stressors that have intermittently worsened her depression.  Is looking to find local resources that she can volunteer or work with.  Interested in [place] referral for this and her depression.  Plan: 1. Continue paroxetine 10mg  daily 2. CCM referral placed for social work assistance

## 2020-07-01 NOTE — Progress Notes (Signed)
Subjective:    Patient ID: Joyce Pace, female    DOB: July 24, 1951, 69 y.o.   MRN: [id number]  Joyce Pace is a 69 y.o. female presenting on 07/01/2020 for Annual Exam   HPI   HEALTH MAINTENANCE:  Weight/BMI: Normal BMI 23.83% Physical activity: Stays active, walks 1 mile per day Diet: Regular Seatbelt: Yes Sunscreen: As needed Mammogram: Due, ordered today PAP: Completed 06/18/2018, no additional screening required DEXA: Due, ordered today Colon cancer screening: Last 06/12/2018, due next 06/13/2023 Hep C Screening: Completed 06/19/2018 GC/CT: Offered and declined Optometry: Every year Dentistry: Every year  IMMUNIZATIONS: Influenza: Due this season Tetanus: Due, encouraged Shingles: Received 01/02/2019 COVID: Discussed Pneumonia: Received pneumococcal 13 - 06/18/2018, received pneumococcal 23 - 07/01/2019  STOP BANG SCREENING Snoring: Do you snore loudly? yes/no: No Tired: Do you often feel tired, fatigued, sleeping during the daytime? yes/no: No Observed: Has anyone observed you stop breathing or choking/gasping during your sleep? yes/no: No Pressure: Do you have or being treated for high blood pressure? yes/no: Yes BMI: Greater than 35? yes/no: No Age: Older than 50? yes/no: Yes Neck Side: Greater than 17 (males)/16 (females)? yes/no: No Gender: Born as female gender? yes/no: No  Depression screen [facility] 2/9 07/01/2020 07/01/2019 06/23/2019  Decreased Interest 0 1 0  Down, Depressed, Hopeless 1 1 1   PHQ - 2 Score 1 2 1   Altered sleeping 1 0 -  Tired, decreased energy 0 1 -  Change in appetite 0 0 -  Feeling bad or failure about yourself  1 0 -  Trouble concentrating 0 0 -  Moving slowly or fidgety/restless 0 0 -  Suicidal thoughts 0 0 -  PHQ-9 Score 3 3 -  Difficult doing work/chores Not difficult at all Not difficult at all -    Past Medical History:  Diagnosis Date  . Anxiety   . Colon cancer ([place]) 2014  . Hypertension    Past Surgical  History:  Procedure Laterality Date  . CHOLECYSTECTOMY    . COLON SURGERY  2016   colon resection  . COLONOSCOPY WITH PROPOFOL N/A 06/12/2018   Procedure: COLONOSCOPY WITH PROPOFOL;  Surgeon: Virgel Manifold, MD;  Location: [facility] ENDOSCOPY;  Service: Endoscopy;  Laterality: N/A;  . ESOPHAGOGASTRODUODENOSCOPY (EGD) WITH PROPOFOL N/A 06/12/2018   Procedure: ESOPHAGOGASTRODUODENOSCOPY (EGD) WITH PROPOFOL;  Surgeon: Virgel Manifold, MD;  Location: [facility] ENDOSCOPY;  Service: Endoscopy;  Laterality: N/A;   Social History   Socioeconomic History  . Marital status: Married    Spouse name: Not on file  . Number of children: Not on file  . Years of education: Not on file  . Highest education level: High school graduate  Occupational History  . Occupation: retired  Tobacco Use  . Smoking status: Never Smoker  . Smokeless tobacco: Never Used  Vaping Use  . Vaping Use: Never used  Substance and Sexual Activity  . Alcohol use: No  . Drug use: No  . Sexual activity: Not Currently  Other Topics Concern  . Not on file  Social History Narrative  . Not on file   Social Determinants of Health   Financial Resource Strain:   . Difficulty of Paying Living Expenses: Not on file  Food Insecurity:   . Worried About Charity fundraiser in the Last Year: Not on file  . Ran Out of Food in the Last Year: Not on file  Transportation Needs:   . Lack of Transportation (Medical): Not on file  . Lack of Transportation (Non-Medical):  Not on file  Physical Activity:   . Days of Exercise per Week: Not on file  . Minutes of Exercise per Session: Not on file  Stress:   . Feeling of Stress : Not on file  Social Connections:   . Frequency of Communication with Friends and Family: Not on file  . Frequency of Social Gatherings with Friends and Family: Not on file  . Attends Religious Services: Not on file  . Active Member of Clubs or Organizations: Not on file  . Attends Archivist Meetings:  Not on file  . Marital Status: Not on file  Intimate Partner Violence:   . Fear of Current or Ex-Partner: Not on file  . Emotionally Abused: Not on file  . Physically Abused: Not on file  . Sexually Abused: Not on file   Family History  Problem Relation Age of Onset  . Hyperlipidemia Mother   . Hypertension Mother   . Diabetes Father   . Hypertension Sister   . Hyperlipidemia Sister   . Hyperlipidemia Brother   . Hypertension Brother   . Ovarian cancer Daughter   . Healthy Son   . Heart disease Maternal Grandmother   . Heart disease Maternal Grandfather   . Heart disease Paternal Grandmother   . Heart disease Paternal Grandfather   . Hyperlipidemia Sister   . Healthy Daughter    Current Outpatient Medications on File Prior to Visit  Medication Sig  . aspirin EC 81 MG tablet Take 1 tablet (81 mg total) by mouth daily. (Patient taking differently: Take 81 mg by mouth daily as needed. )  . omeprazole (PRILOSEC) 20 MG capsule TAKE 1 CAPSULE(20 MG) BY MOUTH DAILY (Patient taking differently: Take 20 mg by mouth daily as needed. )   No current facility-administered medications on file prior to visit.    Per HPI unless specifically indicated above     Objective:    BP 133/68 (BP Location: Right Arm, Patient Position: Sitting, Cuff Size: Normal)   Pulse 67   Temp 98.1 F (36.7 C) (Oral)   Resp 18   Ht 5\' 5"  (1.651 m)   Wt 143 lb 3.2 oz (65 kg)   SpO2 100%   BMI 23.83 kg/m   Wt Readings from Last 3 Encounters:  07/01/20 143 lb 3.2 oz (65 kg)  03/22/20 177 lb 4.8 oz (80.4 kg)  07/01/19 151 lb 9.6 oz (68.8 kg)    Physical Exam Vitals reviewed.  Constitutional:      General: She is not in acute distress.    Appearance: Normal appearance. She is well-developed, well-groomed and normal weight. She is not ill-appearing or toxic-appearing.  HENT:     Head: Normocephalic and atraumatic.     Right Ear: Tympanic membrane, ear canal and external ear normal. There is no  impacted cerumen.     Left Ear: Tympanic membrane, ear canal and external ear normal. There is no impacted cerumen.     Nose: Nose normal. No congestion or rhinorrhea.     Mouth/Throat:     Lips: Pink.     Mouth: Mucous membranes are moist.     Pharynx: Oropharynx is clear. Uvula midline. No oropharyngeal exudate or posterior oropharyngeal erythema.  Eyes:     General: Lids are normal. Vision grossly intact. No scleral icterus.       Right eye: No discharge.        Left eye: No discharge.     Extraocular Movements: Extraocular movements intact.  Conjunctiva/sclera: Conjunctivae normal.     Pupils: Pupils are equal, round, and reactive to light.  Neck:     Thyroid: No thyroid mass or thyromegaly.  Cardiovascular:     Rate and Rhythm: Normal rate and regular rhythm.     Pulses: Normal pulses.          Dorsalis pedis pulses are 2+ on the right side and 2+ on the left side.     Heart sounds: Normal heart sounds. No murmur heard.  No friction rub. No gallop.   Pulmonary:     Effort: Pulmonary effort is normal. No respiratory distress.     Breath sounds: Normal breath sounds.  Abdominal:     General: Abdomen is flat. Bowel sounds are normal. There is no distension.     Palpations: Abdomen is soft. There is no hepatomegaly, splenomegaly or mass.     Tenderness: There is no abdominal tenderness. There is no guarding or rebound.     Hernia: No hernia is present.  Musculoskeletal:        General: Normal range of motion.     Cervical back: Normal range of motion and neck supple. No tenderness.     Right lower leg: No edema.     Left lower leg: No edema.     Comments: Normal tone, strength 5/5 BUE & BLE  Feet:     Right foot:     Skin integrity: Skin integrity normal.     Left foot:     Skin integrity: Skin integrity normal.  Lymphadenopathy:     Cervical: No cervical adenopathy.  Skin:    General: Skin is warm and dry.     Capillary Refill: Capillary refill takes less than 2  seconds.  Neurological:     General: No focal deficit present.     Mental Status: She is alert and oriented to person, place, and time.     Cranial Nerves: No cranial nerve deficit.     Sensory: No sensory deficit.     Motor: No weakness.     Coordination: Coordination normal.     Gait: Gait normal.     Deep Tendon Reflexes: Reflexes normal.  Psychiatric:        Attention and Perception: Attention and perception normal.        Mood and Affect: Mood and affect normal.        Speech: Speech normal.        Behavior: Behavior normal. Behavior is cooperative.        Thought Content: Thought content normal.        Cognition and Memory: Cognition and memory normal.        Judgment: Judgment normal.     Results for orders placed or performed in visit on 07/01/20  POCT Urinalysis Dipstick  Result Value Ref Range   Color, UA Yellow    Clarity, UA clear    Glucose, UA Negative Negative   Bilirubin, UA negative    Ketones, UA negative    Spec Grav, UA 1.015 1.010 - 1.025   Blood, UA trace    pH, UA 5.0 5.0 - 8.0   Protein, UA Negative Negative   Urobilinogen, UA 0.2 0.2 or 1.0 E.U./dL   Nitrite, UA negative    Leukocytes, UA Negative Negative   Appearance     Odor        Assessment & Plan:   Problem List Items Addressed This Visit      Cardiovascular and Mediastinum  Essential hypertension    Controlled hypertension.  Pt is working on lifestyle modifications.  Taking medications tolerating well without side effects.  Complications: GERD, HLD  Plan: 1. Continue taking lisinopril 20mg  daily 2. Obtain labs today  3. Encouraged heart healthy diet and increasing exercise to 30 minutes most days of the week, going no more than 2 days in a row without exercise. 4. Check BP 1-2 x per week at home, keep log, and bring to clinic at next appointment. 5. Follow up 6 months.       Relevant Medications   lisinopril (ZESTRIL) 20 MG tablet   simvastatin (ZOCOR) 40 MG tablet   Other  Relevant Orders   POCT Urinalysis Dipstick (Completed)   CBC with Differential   COMPLETE METABOLIC PANEL WITH GFR     Genitourinary   Microscopic hematuria    POCT u/a with microscopic hematuria.  Plan: 1. Urine sent to lab for microscopy      Relevant Orders   Urinalysis, microscopic only     Other   Hyperlipidemia    Stable and well controlled based on labs from 06/2019, will have repeat labs drawn.  Currently taking simvastatn 40mg  and tolerating it well.  Reviewed common side effects of myalgia (reversible off med), less common side effects of cognitive impairment (reversible off med), increased glucose, rhabdomyolysis.  Plan: 1) Labs ordered for today 2) Continue simvastatin 40mg  daily  3) Heart healthy diet and to exercise every other day for 30 minutes per day, going no more than 2 days in a row without exercise. 4) We will see you back in 6 months       Relevant Medications   lisinopril (ZESTRIL) 20 MG tablet   simvastatin (ZOCOR) 40 MG tablet   Other Relevant Orders   Lipid Profile   Anxiety and depression    PHQ9-3/GAD7-0.  Currently taking paroxetine 10mg  daily.  Reports sometimes will take every other day with continued good relief of symptoms.  Has had some increased stressors that have intermittently worsened her depression.  Is looking to find local resources that she can volunteer or work with.  Interested in [place] referral for this and her depression.  Plan: 1. Continue paroxetine 10mg  daily 2. CCM referral placed for social work assistance      Relevant Medications   PARoxetine (PAXIL) 10 MG tablet   Other Relevant Orders   Ambulatory referral to Chronic Care Management Services   Annual physical exam - Primary    Annual physical exam without new findings.  Well adult with no acute concerns.  Plan: 1. Obtain health maintenance screenings as above according to age. - Increase physical activity to 30 minutes most days of the week.  - Eat healthy diet  high in vegetables and fruits; low in refined carbohydrates. - Screening labs and tests as ordered 2. Return 1 year for annual physical.       Encounter for screening mammogram for malignant neoplasm of breast    Pt last mammogram not on file, unsure when completed last.   Plan: 1. Screening mammogram order placed.  Pt will call to schedule appointment.  Information given.       Relevant Orders   MM 3D SCREEN BREAST BILATERAL   Screening for osteoporosis    Pt postmenopausal w/out history of prior DEXA scan.    Plan: 1. Obtain DG bone density.         Relevant Orders   DG Bone Density    Other Visit Diagnoses  Weight loss       Relevant Orders   Thyroid Panel With TSH   Complicated grief       Relevant Orders   Ambulatory referral to Chronic Care Management Services      Meds ordered this encounter  Medications  . lisinopril (ZESTRIL) 20 MG tablet    Sig: TAKE 1 TABLET(20 MG) BY MOUTH DAILY    Dispense:  90 tablet    Refill:  1  . PARoxetine (PAXIL) 10 MG tablet    Sig: Take 1 tablet (10 mg total) by mouth daily.    Dispense:  90 tablet    Refill:  1  . simvastatin (ZOCOR) 40 MG tablet    Sig: TAKE 1 TABLET(40 MG) BY MOUTH AT BEDTIME    Dispense:  90 tablet    Refill:  3    Follow up plan: Return in about 6 months (around 01/01/2021) for HTN, depression f/u.  Harlin Rain, FNP-C Family Nurse Practitioner [facility] 07/01/2020, 12:34 PM

## 2020-07-01 NOTE — Assessment & Plan Note (Signed)
POCT u/a with microscopic hematuria.  Plan: 1. Urine sent to lab for microscopy

## 2020-07-02 LAB — LIPID PANEL
Cholesterol: 209 mg/dL — ABNORMAL HIGH (ref <200)
HDL: 60 mg/dL (ref >=50)
LDL Cholesterol (Calc): 127 mg/dL (calc) — ABNORMAL HIGH
Non-HDL Cholesterol (Calc): 149 mg/dL (calc) — ABNORMAL HIGH (ref <130)
Total CHOL/HDL Ratio: 3.5 (calc) (ref <5.0)
Triglycerides: 117 mg/dL (ref <150)

## 2020-07-02 LAB — COMPLETE METABOLIC PANEL WITH GFR
AG Ratio: 1.7 (calc) (ref 1.0–2.5)
ALT: 12 U/L (ref 6–29)
AST: 17 U/L (ref 10–35)
Albumin: 4.5 g/dL (ref 3.6–5.1)
Alkaline phosphatase (APISO): 97 U/L (ref 37–153)
BUN: 14 mg/dL (ref 7–25)
CO2: 30 mmol/L (ref 20–32)
Calcium: 10 mg/dL (ref 8.6–10.4)
Chloride: 104 mmol/L (ref 98–110)
Creat: 0.76 mg/dL (ref 0.50–0.99)
GFR, Est African American: 94 mL/min/{1.73_m2} (ref >=60)
GFR, Est Non African American: 81 mL/min/{1.73_m2} (ref >=60)
Globulin: 2.6 g/dL (calc) (ref 1.9–3.7)
Glucose, Bld: 94 mg/dL (ref 65–99)
Potassium: 4.6 mmol/L (ref 3.5–5.3)
Sodium: 140 mmol/L (ref 135–146)
Total Bilirubin: 1.5 mg/dL — ABNORMAL HIGH (ref 0.2–1.2)
Total Protein: 7.1 g/dL (ref 6.1–8.1)

## 2020-07-02 LAB — URINALYSIS, MICROSCOPIC ONLY
Bacteria, UA: NONE SEEN /HPF
Hyaline Cast: NONE SEEN /LPF
RBC / HPF: NONE SEEN /HPF (ref 0–2)
Squamous Epithelial / HPF: NONE SEEN /HPF (ref <=5)
WBC, UA: NONE SEEN /HPF (ref 0–5)

## 2020-07-02 LAB — CBC WITH DIFFERENTIAL/PLATELET
Absolute Monocytes: 335 cells/uL (ref 200–950)
Basophils Absolute: 30 cells/uL (ref 0–200)
Basophils Relative: 0.7 %
Eosinophils Absolute: 206 cells/uL (ref 15–500)
Eosinophils Relative: 4.8 %
HCT: 44.3 % (ref 35.0–45.0)
Hemoglobin: 13.9 g/dL (ref 11.7–15.5)
Lymphs Abs: 1518 cells/uL (ref 850–3900)
MCH: 24.7 pg — ABNORMAL LOW (ref 27.0–33.0)
MCHC: 31.4 g/dL — ABNORMAL LOW (ref 32.0–36.0)
MCV: 78.8 fL — ABNORMAL LOW (ref 80.0–100.0)
MPV: 11.5 fL (ref 7.5–12.5)
Monocytes Relative: 7.8 %
Neutro Abs: 2210 cells/uL (ref 1500–7800)
Neutrophils Relative %: 51.4 %
Platelets: 234 10*3/uL (ref 140–400)
RBC: 5.62 10*6/uL — ABNORMAL HIGH (ref 3.80–5.10)
RDW: 13.4 % (ref 11.0–15.0)
Total Lymphocyte: 35.3 %
WBC: 4.3 10*3/uL (ref 3.8–10.8)

## 2020-07-02 LAB — THYROID PANEL WITH TSH
Free Thyroxine Index: 2.3 (ref 1.4–3.8)
T3 Uptake: 29 % (ref 22–35)
T4, Total: 8 ug/dL (ref 5.1–11.9)
TSH: 1.04 mIU/L (ref 0.40–4.50)

## 2020-07-07 NOTE — Chronic Care Management (AMB) (Signed)
  Chronic Care Management   Outreach Note  07/07/2020 Name: Joyce Pace MRN: [id number] DOB: [birth year]  Joyce Pace is a 68 y.o. year old female who is a primary care patient of Lorine Bears, Lupita Raider, FNP. I reached out to Joyce Pace by phone today in response to a referral sent by [name]'s PCP, Joyce Skeeters, FNP      A second unsuccessful telephone outreach was attempted today. The patient was referred to the case management team for assistance with care management and care coordination.   Follow Up Plan: A HIPPA compliant phone message was left for the patient providing contact information and requesting a return call.  The care management team will reach out to the patient again over the next 5 days.  If patient returns call to provider office, please advise to call [facility] at [place], [place], [place],  [address] Direct Dial: [phone] [email or web address]

## 2020-07-15 ENCOUNTER — Inpatient Hospital Stay: Payer: Medicare Other

## 2020-07-15 ENCOUNTER — Inpatient Hospital Stay: Payer: Medicare Other | Admitting: Oncology

## 2020-07-18 ENCOUNTER — Inpatient Hospital Stay: Payer: Medicare Other | Attending: Oncology

## 2020-07-18 ENCOUNTER — Other Ambulatory Visit: Payer: Self-pay

## 2020-07-18 ENCOUNTER — Encounter: Payer: Self-pay | Admitting: Oncology

## 2020-07-18 ENCOUNTER — Inpatient Hospital Stay (HOSPITAL_BASED_OUTPATIENT_CLINIC_OR_DEPARTMENT_OTHER): Payer: Medicare Other | Admitting: Oncology

## 2020-07-18 VITALS — BP 151/77 | HR 70 | Temp 98.4°F | Resp 16 | Ht 65.0 in | Wt 145.3 lb

## 2020-07-18 DIAGNOSIS — D509 Iron deficiency anemia, unspecified: Secondary | ICD-10-CM

## 2020-07-18 DIAGNOSIS — Z86010 Personal history of colonic polyps: Secondary | ICD-10-CM | POA: Diagnosis not present

## 2020-07-18 DIAGNOSIS — Z8041 Family history of malignant neoplasm of ovary: Secondary | ICD-10-CM | POA: Insufficient documentation

## 2020-07-18 DIAGNOSIS — Z85038 Personal history of other malignant neoplasm of large intestine: Secondary | ICD-10-CM

## 2020-07-18 DIAGNOSIS — E538 Deficiency of other specified B group vitamins: Secondary | ICD-10-CM | POA: Insufficient documentation

## 2020-07-18 LAB — CBC WITH DIFFERENTIAL/PLATELET
Abs Immature Granulocytes: 0.01 10*3/uL (ref 0.00–0.07)
Basophils Absolute: 0 10*3/uL (ref 0.0–0.1)
Basophils Relative: 1 %
Eosinophils Absolute: 0.3 10*3/uL (ref 0.0–0.5)
Eosinophils Relative: 5 %
HCT: 38.7 % (ref 36.0–46.0)
Hemoglobin: 12.5 g/dL (ref 12.0–15.0)
Immature Granulocytes: 0 %
Lymphocytes Relative: 31 %
Lymphs Abs: 1.5 10*3/uL (ref 0.7–4.0)
MCH: 24.8 pg — ABNORMAL LOW (ref 26.0–34.0)
MCHC: 32.3 g/dL (ref 30.0–36.0)
MCV: 76.6 fL — ABNORMAL LOW (ref 80.0–100.0)
Monocytes Absolute: 0.4 10*3/uL (ref 0.1–1.0)
Monocytes Relative: 8 %
Neutro Abs: 2.7 10*3/uL (ref 1.7–7.7)
Neutrophils Relative %: 55 %
Platelets: 197 10*3/uL (ref 150–400)
RBC: 5.05 MIL/uL (ref 3.87–5.11)
RDW: 14.2 % (ref 11.5–15.5)
WBC: 4.8 10*3/uL (ref 4.0–10.5)
nRBC: 0 % (ref 0.0–0.2)

## 2020-07-18 LAB — IRON AND TIBC
Iron: 55 ug/dL (ref 28–170)
Saturation Ratios: 17 % (ref 10.4–31.8)
TIBC: 330 ug/dL (ref 250–450)
UIBC: 275 ug/dL

## 2020-07-18 LAB — VITAMIN B12: Vitamin B-12: 295 pg/mL (ref 180–914)

## 2020-07-18 LAB — FERRITIN: Ferritin: 36 ng/mL (ref 11–307)

## 2020-07-18 NOTE — Progress Notes (Signed)
Pt has constipation and after 2-3 days she will take a colace or a colon cleanse capsule. She has hard stools. Does not have pain in stomach. Eats good and drinks good. 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 Pt. Sad that she does not have her own house and independence. She is on antidepressant and does not take it but 2 days a week. I encouraged her to take every day how it is prescribed

## 2020-07-18 NOTE — Chronic Care Management (AMB) (Signed)
  Chronic Care Management   Outreach Note  07/18/2020 Name: Joyce Pace MRN: [id number] DOB: [birth year]  Joyce Pace is a 69 y.o. year old female who is a primary care patient of Joyce Pace, Joyce Raider, Joyce Pace. I reached out to Joyce Pace by phone today in response to a referral sent by Joyce Pace's PCP, Joyce Skeeters, Joyce Pace     Third unsuccessful telephone outreach was attempted today. The patient was referred to the case management team for assistance with care management and care coordination. The patient's primary care provider has been notified of our unsuccessful attempts to make or maintain contact with the patient. The care management team is pleased to engage with this patient at any time in the future should he/she be interested in assistance from the care management team.   Follow Up Plan: We have been unable to make contact with the patient for follow up. The care management team is available to follow up with the patient after provider conversation with the patient regarding recommendation for care management engagement and subsequent re-referral to the care management team.   Joyce Pace, [place] [place], [place], [place] [address] Direct Dial: [phone] [email or web address]: [email or web address]

## 2020-07-18 NOTE — Progress Notes (Signed)
Hematology/Oncology Consult note [facility]  Telephone:([phone] [phone]  Patient Care Team: Verl Bangs, FNP as PCP - General (Family Medicine) Virgel Manifold, MD as Consulting Physician (Gastroenterology)   Name of the patient: Joyce Pace  [id number]  Sep 22, 1951   Date of visit: 07/18/20  Diagnosis- 1. H/o colon cancer 2. Microcytosis without anemia 3. b12 deficiency  Chief complaint/ Reason for visit-routine follow-up of anemia  Heme/Onc history: Patient is a 69 year old female with a prior history of colon cancer that was diagnosed in 67 in [place].This was stage IIa PT 3 PN 0 grade 2. Final pathology showed moderately differentiated adenocarcinoma extensively ulcerated with extension into muscularis propria and pericolonic adipose tissue. Maximum size of the tumor was 5 cm with significant luminal obstruction. Margins were -15 lymph nodes were negative for malignancy. Patient underwent 12 cycles of adjuvant FOLFOX in September 2014. She also subsequently had surveillance colonoscopy done and most recently was seen by Dr. Bonna Gains and underwent EGD and colonoscopy which was unremarkable. Colonoscopy in August 2019 showed a 5 mm sigmoid polyp which was negative for malignancy. Last CT scan was in September 2017 which showed no evidence of malignancy. Most recent CBC from 06/19/2018 showed white count of 6.3, H&H of 12.1/37.5 with an MCV of 75.8 and a platelet count of 168.   Interval history-patient reports feeling fatigued which is chronic.  Reports feeling anxious.  She is living with her daughter and her husband.  Her husband is on dialysis which is adding to her stressors.  ECOG PS- 1 Pain scale- 0  Review of systems- Review of Systems  Constitutional: Positive for malaise/fatigue. Negative for chills, fever and weight loss.  HENT: Negative for congestion, ear discharge and nosebleeds.   Eyes: Negative  for blurred vision.  Respiratory: Negative for cough, hemoptysis, sputum production, shortness of breath and wheezing.   Cardiovascular: Negative for chest pain, palpitations, orthopnea and claudication.  Gastrointestinal: Negative for abdominal pain, blood in stool, constipation, diarrhea, heartburn, melena, nausea and vomiting.  Genitourinary: Negative for dysuria, flank pain, frequency, hematuria and urgency.  Musculoskeletal: Negative for back pain, joint pain and myalgias.  Skin: Negative for rash.  Neurological: Negative for dizziness, tingling, focal weakness, seizures, weakness and headaches.  Endo/Heme/Allergies: Does not bruise/bleed easily.  Psychiatric/Behavioral: Negative for depression and suicidal ideas. The patient does not have insomnia.        Allergies  Allergen Reactions   Flax Seed [Bio-Flax] Other (See Comments)    Throat gets itching and difficultly swallowing     Past Medical History:  Diagnosis Date   Anemia    Anxiety    Colon cancer ([place]) 2014   Hypertension      Past Surgical History:  Procedure Laterality Date   CHOLECYSTECTOMY     COLON SURGERY  2016   colon resection   COLONOSCOPY WITH PROPOFOL N/A 06/12/2018   Procedure: COLONOSCOPY WITH PROPOFOL;  Surgeon: Virgel Manifold, MD;  Location: [facility] END[facility];  Service: Endoscopy;  Laterality: N/A;   ESOPHAGOGASTRODUODENOSCOPY (EGD) WITH PROPOFOL N/A 06/12/2018   Procedure: ESOPHAGOGASTRODUODENOSCOPY (EGD) WITH PROPOFOL;  Surgeon: Virgel Manifold, MD;  Location: [facility] ENDOSCOPY;  Service: Endoscopy;  Laterality: N/A;    Social History   Socioeconomic History   Marital status: Married    Spouse name: Not on file   Number of children: Not on file   Years of education: Not on file   Highest education level: High school graduate  Occupational History  Occupation: retired  Tobacco Use   Smoking status: Never Smoker   Smokeless tobacco: Never Used  Scientific laboratory technician  Use: Never used  Substance and Sexual Activity   Alcohol use: No   Drug use: No   Sexual activity: Not Currently  Other Topics Concern   Not on file  Social History Narrative   Not on file   Social Determinants of Health   Financial Resource Strain:    Difficulty of Paying Living Expenses: Not on file  Food Insecurity:    Worried About Charity fundraiser in the Last Year: Not on file   [employer] of Food in the Last Year: Not on file  Transportation Needs:    Lack of Transportation (Medical): Not on file   Lack of Transportation (Non-Medical): Not on file  Physical Activity:    Days of Exercise per Week: Not on file   Minutes of Exercise per Session: Not on file  Stress:    Feeling of Stress : Not on file  Social Connections:    Frequency of Communication with Friends and Family: Not on file   Frequency of Social Gatherings with Friends and Family: Not on file   Attends Religious Services: Not on file   Active Member of Clubs or Organizations: Not on file   Attends Archivist Meetings: Not on file   Marital Status: Not on file  Intimate Partner Violence:    Fear of Current or Ex-Partner: Not on file   Emotionally Abused: Not on file   Physically Abused: Not on file   Sexually Abused: Not on file    Family History  Problem Relation Age of Onset   Hyperlipidemia Mother    Hypertension Mother    Diabetes Father    Hypertension Sister    Hyperlipidemia Sister    Hyperlipidemia Brother    Hypertension Brother    Ovarian cancer Daughter    Healthy Son    Heart disease Maternal Grandmother    Heart disease Maternal Grandfather    Heart disease Paternal Grandmother    Heart disease Paternal Grandfather    Hyperlipidemia Sister    Healthy Daughter      Current Outpatient Medications:    aspirin EC 81 MG tablet, Take 1 tablet (81 mg total) by mouth daily. (Patient taking differently: Take 81 mg by mouth daily as needed.  ), Disp: , Rfl:    docusate sodium (COLACE) 100 MG capsule, Take 100 mg by mouth daily as needed for mild constipation., Disp: , Rfl:    lisinopril (ZESTRIL) 20 MG tablet, TAKE 1 TABLET(20 MG) BY MOUTH DAILY, Disp: 90 tablet, Rfl: 1   Misc Natural Products (COLON CLEANSE) CAPS, Take 1 capsule by mouth daily as needed., Disp: , Rfl:    omeprazole (PRILOSEC) 20 MG capsule, TAKE 1 CAPSULE(20 MG) BY MOUTH DAILY (Patient taking differently: Take 20 mg by mouth daily as needed. ), Disp: 90 capsule, Rfl: 0   PARoxetine (PAXIL) 10 MG tablet, Take 1 tablet (10 mg total) by mouth daily., Disp: 90 tablet, Rfl: 1   simvastatin (ZOCOR) 40 MG tablet, TAKE 1 TABLET(40 MG) BY MOUTH AT BEDTIME, Disp: 90 tablet, Rfl: 3  Physical exam:  Vitals:   07/18/20 1013 07/18/20 1014  BP:  (!) 151/77  Pulse:  70  Resp:  16  Temp:  98.4 F (36.9 C)  TempSrc:  Oral  Weight: 145 lb 4.8 oz (65.9 kg)   Height: 5\' 5"  (1.651 m)  Physical Exam Constitutional:      General: She is not in acute distress. Cardiovascular:     Rate and Rhythm: Normal rate and regular rhythm.     Heart sounds: Normal heart sounds.  Pulmonary:     Effort: Pulmonary effort is normal.     Breath sounds: Normal breath sounds.  Abdominal:     General: Bowel sounds are normal.     Palpations: Abdomen is soft.  Skin:    General: Skin is warm and dry.  Neurological:     Mental Status: She is alert and oriented to person, place, and time.      CMP Latest Ref Rng & Units 07/01/2020  Glucose 65 - 99 mg/dL 94  BUN 7 - 25 mg/dL 14  Creatinine 0.50 - 0.99 mg/dL 0.76  Sodium 135 - 146 mmol/L 140  Potassium 3.5 - 5.3 mmol/L 4.6  Chloride 98 - 110 mmol/L 104  CO2 20 - 32 mmol/L 30  Calcium 8.6 - 10.4 mg/dL 10.0  Total Protein 6.1 - 8.1 g/dL 7.1  Total Bilirubin 0.2 - 1.2 mg/dL 1.5(H)  Alkaline Phos 38 - 126 U/L -  AST 10 - 35 U/L 17  ALT 6 - 29 U/L 12   CBC Latest Ref Rng & Units 07/18/2020  WBC 4.0 - 10.5 K/uL 4.8  Hemoglobin  12.0 - 15.0 g/dL 12.5  Hematocrit 36 - 46 % 38.7  Platelets 150 - 400 K/uL 197     Assessment and plan- Patient is a 69 y.o. female with history of stage II colon cancer in 2014.  This is a routine follow-up visit for anemia  Microcytic anemia.  She has chronic microcytosis without overt evidence of iron deficiency.  Hemoglobin has remained stable around 12.She continues to have low B12 levels of 295 and I have asked her to continue taking oral B12 1000 mcg daily.  History of colon cancer: This is more than 5 years ago and does not require any surveillance imaging or labs.   Visit Diagnosis 1. Microcytic anemia   2. History of colon cancer   3. B12 deficiency      Dr. Randa Evens, MD, MPH [facility] [phone] 07/18/2020 4:24 PM

## 2020-12-05 ENCOUNTER — Telehealth: Payer: Self-pay | Admitting: Oncology

## 2020-12-05 NOTE — Telephone Encounter (Signed)
Pt called to r/s her appts from 3/14 to 4/14. She also stated that she has been experiencing acute anxiety and would like to speak with someone about medication.

## 2020-12-05 NOTE — Telephone Encounter (Signed)
She will need to speak to her pcp. I cannot be giving her medication for that. I only see her for b12 deficiency

## 2020-12-05 NOTE — Telephone Encounter (Signed)
Call returned to patient and advised to contact her PCP, she said that she had asked , but there is no doc on site there. She voiced understanding that we will not order this for her thanked me for my time.

## 2020-12-07 ENCOUNTER — Ambulatory Visit: Payer: Medicare Other | Admitting: Advanced Practice Midwife

## 2020-12-07 DIAGNOSIS — Z20822 Contact with and (suspected) exposure to covid-19: Secondary | ICD-10-CM | POA: Diagnosis not present

## 2021-01-01 DIAGNOSIS — R309 Painful micturition, unspecified: Secondary | ICD-10-CM | POA: Diagnosis not present

## 2021-01-01 DIAGNOSIS — N390 Urinary tract infection, site not specified: Secondary | ICD-10-CM | POA: Diagnosis not present

## 2021-01-01 DIAGNOSIS — Z634 Disappearance and death of family member: Secondary | ICD-10-CM | POA: Diagnosis not present

## 2021-01-06 ENCOUNTER — Telehealth: Payer: Self-pay | Admitting: Oncology

## 2021-01-06 ENCOUNTER — Telehealth: Payer: Self-pay

## 2021-01-06 NOTE — Telephone Encounter (Signed)
Pt called  (See previous note)... Left a voicemail stating Dr. Janese Banks is on vacation and at this time we cant confirm that its ok to have her labs and appointment down out of town.SJC

## 2021-01-06 NOTE — Telephone Encounter (Signed)
Patient called and stated her husband has recently passed and that she will be in [place] indefinitely. She wanted to ask if it was possible to have her lab work done in [place] and have a virtual visit with Dr. Janese Banks.   Appointment is scheduled for 02/16/21.

## 2021-01-11 ENCOUNTER — Telehealth: Payer: Self-pay | Admitting: *Deleted

## 2021-01-11 ENCOUNTER — Telehealth: Payer: Self-pay

## 2021-01-11 DIAGNOSIS — T3695XA Adverse effect of unspecified systemic antibiotic, initial encounter: Secondary | ICD-10-CM

## 2021-01-11 DIAGNOSIS — B379 Candidiasis, unspecified: Secondary | ICD-10-CM

## 2021-01-11 MED ORDER — FLUCONAZOLE 150 MG PO TABS: ORAL_TABLET | ORAL | 0 refills | Status: DC

## 2021-01-11 NOTE — Telephone Encounter (Signed)
The patient state she was seen at an Urgent Care in [place] and treated for a UTI with Ciprofloxacin. She complains now of persistent vaginal itching and irritation post antibiotics. The urinary symptoms have improved. The pt state she was also put on Hydroxyzine PRN for situational anxiety. She reports her husband just passed away x 1 week ago. She state the hydroxyzine helps some with the itching.

## 2021-01-11 NOTE — Telephone Encounter (Signed)
Hi Joyce Pace, the patient is experiencing itching with white areas of yeast she believes. Just completed antibiotic on 01/07/21. Would Dr. Raliegh Ip call in something for yeast at her new pharmacy listed below. She is in the process of establishing new provider in [place].

## 2021-01-11 NOTE — Telephone Encounter (Signed)
Can you confirm what she was treated for with the antibiotic and where?  If she went to an urgent care to get evaluated and treated with an antibiotic, then that is okay and I can send in the Diflucan course as a one time option for her, but then going forward she will need to establish with new PCP in [place].  If she was not seen by a provider and only took an antibiotic - for example a self diagnosis and had a prior antibiotic, then I would ask that she seek care at an Urgent Care for evaluation and they can diagnose and treat for yeast if needed or other issue.  Let me know, and confirm which pharmacy if you can. Thank you.  Nobie Putnam, [facility] 01/11/2021, 10:37 AM

## 2021-01-11 NOTE — Telephone Encounter (Signed)
Opened new encounter in error. See addendum to earlier encounter.

## 2021-01-11 NOTE — Telephone Encounter (Signed)
Patient called to report that she is our of town for an extended period due to the recent death of her husband. She would like to move her next appointment out by a month. If she needs to have labs  sooner could they be scheduled in [place].

## 2021-01-11 NOTE — Telephone Encounter (Signed)
Sent Diflucan for yeast infection from antibiotic.  Rx sent to her requested [facility].  Thanks for checking. Since she has already seen a provider, this is fine. Further follow-up now from new PCP  Nobie Putnam, [facility] 01/11/2021, 11:54 AM

## 2021-01-11 NOTE — Telephone Encounter (Signed)
Please let patent know that if she moves to [place], she will need to find a doctor there. She should be fine seeign a pcp and does not need to see heme/onc. We cannot offer out of state virtual visits on a long term basis

## 2021-01-11 NOTE — Telephone Encounter (Signed)
Copied from [place] [id number]. Topic: General - Other >> Jan 11, 2021  9:20 AM Joyce Pace wrote: Reason for CRM: Patient has recently moved to [facility] with her daughter  Patient believes they are experiencing Pace bacterial infection and has been previously prescribed antibiotics but continues to experience discomfort   Patient would like to be prescribed an additional medication and have it sent to [facility] pharmacy in [place] (patient was uncertain of what pharmacy at the time of call)  Patient would also like to be referred to [facility] physician in [place] that can assist her further  Please contact to advise >> Jan 11, 2021  9:35 AM Joyce Pace wrote: [id number] - [place], [place] - [address]  Is the pharmacy that patient would like to use

## 2021-01-12 ENCOUNTER — Telehealth: Payer: Self-pay | Admitting: *Deleted

## 2021-01-12 NOTE — Telephone Encounter (Signed)
FYI...  Pt left a message stating that she was out of town and will be for some time and wants to know if her sched 02/16/21 lab orders can be faxed to [place].. Pt is requesting a call back. Dr. Janese Banks team was made aware of pt request.

## 2021-01-13 ENCOUNTER — Telehealth: Payer: Self-pay | Admitting: *Deleted

## 2021-01-13 ENCOUNTER — Telehealth: Payer: Self-pay

## 2021-01-13 NOTE — Telephone Encounter (Signed)
Unfortunately, I believe at this point this is getting more complex than I can help with remotely.  Stinging / itching episode can be from a bite or sting or something else, I am not quite clear on. Yes using hydroxyzine or can try benadryl or topical cortisone OTC and ice packs may help.  Regarding mood / anxiety, can be within range of normal bereavement following loss of a loved one, usually would advise against starting or changing medication such as anti depressant or anti anxiety med so soon after.  Recommend that she seek out some bereavement / grief counseling and or therapy that may help address some of her emotions and feelings right now.  She can look into a hospice bereavement program that they may have down there to offer free counseling for anyone who has lost family member.  Ultimately, I would not be able to prescribe these medications. She will need to transfer care and establish with a provider down in [place].  Also can let her know that I spoke with Dr Janese Banks and we reviewed her case together and Dr Janese Banks also is in agreement that she will need to identify a specialist in [place] as well for her continued Hematology/Oncology care.  Nobie Putnam, [facility] 01/13/2021, 3:49 PM

## 2021-01-13 NOTE — Telephone Encounter (Signed)
Copied from [place] [id number]. Topic: General - Other >> Jan 13, 2021 12:16 PM Tessa Lerner A wrote: Reason for CRM: Patient has made contact requesting to speak with Bonnye Fava when possible  Patient would like to discuss personal emotions and feelings of discomfort  Please contact to advise

## 2021-01-13 NOTE — Telephone Encounter (Signed)
The pt was notified of Dr. Parks Ranger recommendation. She verbalize understanding but question if Dr. Parks Ranger will give her anything for her anxiety. I restated that he felt like it would be best at this time for her to seek bereavement and grief counseling. She said she understanding. No more questions or concerns.

## 2021-01-13 NOTE — Telephone Encounter (Addendum)
Pt called and wanted a video visit and at first dr Janese Banks was not going to do this. Because she has possibility of living with her daughter in [place]- we can't keep her on for a pt. If she is living there. Right now she is with daughter but not sure if she will continue to be  in [place] or come back to [place]. Pt was wanting anxiety medicine for her anxiety and not sleeping. She feels tingling inher hand. It sounds like she has a lot on her emotionally, her mother is sick also and she will be going to [place] to see her MOm in next 2 weeks.  She has sent in a phone call to PCP to see if they could help her. We gave her a one time video visit 3/22  at 1:45. Pt agreeable and I did tell her that dr Janese Banks has been monitoring her colon cancer 5 years out, then some anemia-she does not feel that she should give anxiety medicine to her. Pt understands

## 2021-01-13 NOTE — Telephone Encounter (Signed)
The pt called complaining of a itching and stinging episode that happen last night. The episode lasted about 10 minutes and subsided. She state this is the first time she ever experienced this. She denies any new medication. The pt husband passed away x 3 weeks ago. She also just recently moved out of state with her daughter. She admits having a lot of anxiety and depression. She state that the hydroxyzine that she was recently prescribed is not helping. Please advise

## 2021-01-16 ENCOUNTER — Ambulatory Visit: Payer: Medicare Other | Admitting: Oncology

## 2021-01-16 ENCOUNTER — Other Ambulatory Visit: Payer: Medicare Other

## 2021-01-24 ENCOUNTER — Inpatient Hospital Stay: Payer: Medicare Other | Attending: Oncology | Admitting: Oncology

## 2021-01-24 DIAGNOSIS — Z08 Encounter for follow-up examination after completed treatment for malignant neoplasm: Secondary | ICD-10-CM | POA: Diagnosis not present

## 2021-01-24 DIAGNOSIS — R918 Other nonspecific abnormal finding of lung field: Secondary | ICD-10-CM

## 2021-01-24 DIAGNOSIS — Z85038 Personal history of other malignant neoplasm of large intestine: Secondary | ICD-10-CM

## 2021-01-24 DIAGNOSIS — E538 Deficiency of other specified B group vitamins: Secondary | ICD-10-CM | POA: Diagnosis not present

## 2021-01-24 DIAGNOSIS — R718 Other abnormality of red blood cells: Secondary | ICD-10-CM | POA: Diagnosis not present

## 2021-01-25 ENCOUNTER — Telehealth: Payer: Self-pay | Admitting: Family Medicine

## 2021-01-25 NOTE — Telephone Encounter (Signed)
Copied from [place] [id number]. Topic: Medicare AWV >> Jan 25, 2021  2:20 PM Cher Nakai R wrote: Reason for CRM:  Left message for patient to call back and schedule the Medicare Annual Wellness Visit (AWV) virtually or by telephone.  Last AWV 06/23/2019  Please schedule at anytime with [place].  40 minute appointment  Any questions, please call me at [phone]

## 2021-01-27 ENCOUNTER — Other Ambulatory Visit: Payer: Self-pay

## 2021-01-27 DIAGNOSIS — F419 Anxiety disorder, unspecified: Secondary | ICD-10-CM

## 2021-01-27 DIAGNOSIS — E782 Mixed hyperlipidemia: Secondary | ICD-10-CM

## 2021-01-27 DIAGNOSIS — F32A Depression, unspecified: Secondary | ICD-10-CM

## 2021-01-27 MED ORDER — PAROXETINE HCL 10 MG PO TABS: 10.0000 mg | ORAL_TABLET | Freq: Every day | ORAL | 1 refills | Status: DC

## 2021-01-27 MED ORDER — SIMVASTATIN 40 MG PO TABS: ORAL_TABLET | ORAL | 3 refills | Status: DC

## 2021-01-29 NOTE — Progress Notes (Signed)
I connected with Joyce Pace on 01/29/21 at  1:45 PM EDT by telephone visit and verified that I am speaking with the correct person using two identifiers.   I discussed the limitations, risks, security and privacy concerns of performing an evaluation and management service by telemedicine and the availability of in-person appointments. I also discussed with the patient that there may be a patient responsible charge related to this service. The patient expressed understanding and agreed to proceed.  Other persons participating in the visit and their role in the encounter:  none  Patient's location:  home Provider's location:  work    Diagnosis- 1. H/o colon cancer 2. Microcytosis without anemia 3. b12 deficiency   Chief Complaint: Routine follow-up of B12 deficiency anemia  History of present illness: Patient is a 70 year old female with a prior history of colon cancer that was diagnosed in 76 in [place].This was stage IIa PT 3 PN 0 grade 2. Final pathology showed moderately differentiated adenocarcinoma extensively ulcerated with extension into muscularis propria and pericolonic adipose tissue. Maximum size of the tumor was 5 cm with significant luminal obstruction. Margins were -15 lymph nodes were negative for malignancy. Patient underwent 12 cycles of adjuvant FOLFOX in September 2014. She also subsequently had surveillance colonoscopy done and most recently was seen by Dr. Bonna Gains and underwent EGD and colonoscopy which was unremarkable. Colonoscopy in August 2019 showed a 5 mm sigmoid polyp which was negative for malignancy. Last CT scan was in September 2017 which showed no evidence of malignancy. Most recent CBC from 06/19/2018 showed white count of 6.3, H&H of 12.1/37.5 with an MCV of 75.8 and a platelet count of 168.   Interval history patient has been recently died and she had moved to [place] [facility] to stay with her daughter.  Presently patient is in [place] with  her mom mother.  She currently reports feelings of anxiety.  She feels depressed at times but denies any suicidal or homicidal ideations   Review of Systems  Constitutional: Negative for chills, fever, malaise/fatigue and weight loss.  HENT: Negative for congestion, ear discharge and nosebleeds.   Eyes: Negative for blurred vision.  Respiratory: Negative for cough, hemoptysis, sputum production, shortness of breath and wheezing.   Cardiovascular: Negative for chest pain, palpitations, orthopnea and claudication.  Gastrointestinal: Negative for abdominal pain, blood in stool, constipation, diarrhea, heartburn, melena, nausea and vomiting.  Genitourinary: Negative for dysuria, flank pain, frequency, hematuria and urgency.  Musculoskeletal: Negative for back pain, joint pain and myalgias.  Skin: Negative for rash.  Neurological: Negative for dizziness, tingling, focal weakness, seizures, weakness and headaches.  Endo/Heme/Allergies: Does not bruise/bleed easily.  Psychiatric/Behavioral: Positive for depression. Negative for suicidal ideas. The patient is nervous/anxious. The patient does not have insomnia.     Allergies  Allergen Reactions  . Flax Seed [Bio-Flax] Other (See Comments)    Throat gets itching and difficultly swallowing    Past Medical History:  Diagnosis Date  . Anemia   . Anxiety   . Colon cancer ([place]) 2014  . Hypertension     Past Surgical History:  Procedure Laterality Date  . CHOLECYSTECTOMY    . COLON SURGERY  2016   colon resection  . COLONOSCOPY WITH PROPOFOL N/A 06/12/2018   Procedure: COLONOSCOPY WITH PROPOFOL;  Surgeon: Virgel Manifold, MD;  Location: [facility] ENDOSCOPY;  Service: Endoscopy;  Laterality: N/A;  . ESOPHAGOGASTRODUODENOSCOPY (EGD) WITH PROPOFOL N/A 06/12/2018   Procedure: ESOPHAGOGASTRODUODENOSCOPY (EGD) WITH PROPOFOL;  Surgeon: Virgel Manifold, MD;  Location[facility]  ENDOSCOPY;  Service: Endoscopy;  Laterality: N/A;    Social History    Socioeconomic History  . Marital status: Married    Spouse name: Not on file  . Number of children: Not on file  . Years of education: Not on file  . Highest education level: High school graduate  Occupational History  . Occupation: retired  Tobacco Use  . Smoking status: Never Smoker  . Smokeless tobacco: Never Used  Vaping Use  . Vaping Use: Never used  Substance and Sexual Activity  . Alcohol use: No  . Drug use: No  . Sexual activity: Not Currently  Other Topics Concern  . Not on file  Social History Narrative  . Not on file   Social Determinants of Health   Financial Resource Strain: Not on file  Food Insecurity: Not on file  Transportation Needs: Not on file  Physical Activity: Not on file  Stress: Not on file  Social Connections: Not on file  Intimate Partner Violence: Not on file    Family History  Problem Relation Age of Onset  . Hyperlipidemia Mother   . Hypertension Mother   . Diabetes Father   . Hypertension Sister   . Hyperlipidemia Sister   . Hyperlipidemia Brother   . Hypertension Brother   . Ovarian cancer Daughter   . Healthy Son   . Heart disease Maternal Grandmother   . Heart disease Maternal Grandfather   . Heart disease Paternal Grandmother   . Heart disease Paternal Grandfather   . Hyperlipidemia Sister   . Healthy Daughter      Current Outpatient Medications:  .  aspirin EC 81 MG tablet, Take 1 tablet (81 mg total) by mouth daily. (Patient taking differently: Take 81 mg by mouth daily as needed.), Disp: , Rfl:  .  docusate sodium (COLACE) 100 MG capsule, Take 100 mg by mouth daily as needed for mild constipation., Disp: , Rfl:  .  fluconazole (DIFLUCAN) 150 MG tablet, Take one tablet by mouth on Day 1. Repeat dose 2nd tablet on Day 3., Disp: 2 tablet, Rfl: 0 .  lisinopril (ZESTRIL) 20 MG tablet, TAKE 1 TABLET(20 MG) BY MOUTH DAILY, Disp: 90 tablet, Rfl: 1 .  Misc Natural Products (COLON CLEANSE) CAPS, Take 1 capsule by mouth daily  as needed., Disp: , Rfl:  .  omeprazole (PRILOSEC) 20 MG capsule, TAKE 1 CAPSULE(20 MG) BY MOUTH DAILY (Patient taking differently: Take 20 mg by mouth daily as needed.), Disp: 90 capsule, Rfl: 0 .  PARoxetine (PAXIL) 10 MG tablet, Take 1 tablet (10 mg total) by mouth daily., Disp: 90 tablet, Rfl: 1 .  simvastatin (ZOCOR) 40 MG tablet, TAKE 1 TABLET(40 MG) BY MOUTH AT BEDTIME, Disp: 90 tablet, Rfl: 3  No results found.  No images are attached to the encounter.   CMP Latest Ref Rng & Units 07/01/2020  Glucose 65 - 99 mg/dL 94  BUN 7 - 25 mg/dL 14  Creatinine 0.50 - 0.99 mg/dL 0.76  Sodium 135 - 146 mmol/L 140  Potassium 3.5 - 5.3 mmol/L 4.6  Chloride 98 - 110 mmol/L 104  CO2 20 - 32 mmol/L 30  Calcium 8.6 - 10.4 mg/dL 10.0  Total Protein 6.1 - 8.1 g/dL 7.1  Total Bilirubin 0.2 - 1.2 mg/dL 1.5(H)  Alkaline Phos 38 - 126 U/L -  AST 10 - 35 U/L 17  ALT 6 - 29 U/L 12   CBC Latest Ref Rng & Units 07/18/2020  WBC 4.0 - 10.5 K/uL 4.8  Hemoglobin 12.0 - 15.0 g/dL 12.5  Hematocrit 36.0 - 46.0 % 38.7  Platelets 150 - 400 K/uL 197     Assessment and plan: Patient is a 70 year old female and this is a follow-up of following medical issues:  1.  History of colon cancer: This was diagnosed back in 2014 and I am reassured the patient it has been more than 5 years since her diagnosis she does not require any surveillance scans or tumor marker surveillance at this time.  She does not require to follow-up with oncology for this  2. Small subcentimeter lung nodules which were stable over the.  Of 1 year and did not require any follow-up. History of lung nodules: She has had prior CT chest last one was in December 2020 which showed that these were  3. History of B12 deficiency: She needs to continue taking oral B12 at this time  Overall patient does not have any active s hematology/oncology issues and she should follow up with her primary care doctor.  It appears that patient will be out of [place] for the foreseeable future shuttling between [place] and [place].  I have encouraged the patient to establish her primary care wherever she decides to reside.  She was requesting anxiety medications recently and I explained to her that I would not be able to prescribe these for her and she will need to establish primary care wherever she is.  I will also not be able to provide any video visits moving forward if she does not plan to return back to [place].  As such she does not require hematology oncology follow-up  Follow-up instructions:  I discussed the assessment and treatment plan with the patient. The patient was provided an opportunity to ask questions and all were answered. The patient agreed with the plan and demonstrated an understanding of the instructions.   The patient was advised to call back or seek an in-person evaluation if the symptoms worsen or if the condition fails to improve as anticipated.  I provided 15 minutes of non face-to-face telephone visit time during this encounter, and > 50% was spent counseling as documented under my assessment & plan.  Visit Diagnosis: 1. Encounter for follow-up surveillance of colon cancer   2. Lung nodules   3. B12 deficiency   4. Microcytosis     Dr. Randa Evens, MD, MPH [facility] Tel- [phone] 01/29/2021 10:31 AM

## 2021-02-03 ENCOUNTER — Other Ambulatory Visit: Payer: Self-pay

## 2021-02-03 DIAGNOSIS — I10 Essential (primary) hypertension: Secondary | ICD-10-CM

## 2021-02-03 MED ORDER — LISINOPRIL 20 MG PO TABS: ORAL_TABLET | ORAL | 1 refills | Status: DC

## 2021-02-03 NOTE — Progress Notes (Signed)
Refill on lisinopril sent to [facility]

## 2021-02-16 ENCOUNTER — Ambulatory Visit: Payer: Medicare Other | Admitting: Oncology

## 2021-02-16 ENCOUNTER — Other Ambulatory Visit: Payer: Medicare Other

## 2021-03-07 ENCOUNTER — Other Ambulatory Visit: Payer: Self-pay | Admitting: Family Medicine

## 2021-03-07 DIAGNOSIS — I10 Essential (primary) hypertension: Secondary | ICD-10-CM

## 2021-03-07 NOTE — Telephone Encounter (Signed)
Requested medication (s) are due for refill today:no  Requested medication (s) are on the active medication list:  yes  Last refill:  12/07/2020  Future visit scheduled: no  Notes to clinic:  overdue for labs and follow up appointment    Requested Prescriptions  Pending Prescriptions Disp Refills   lisinopril (ZESTRIL) 20 MG tablet [Pharmacy Med Name: LISINOPRIL 20MG  TABLETS] 90 tablet 1    Sig: TAKE 1 TABLET(20 MG) BY MOUTH DAILY      Cardiovascular:  ACE Inhibitors Failed - 03/07/2021  8:09 AM      Failed - Cr in normal range and within 180 days    Creat  Date Value Ref Range Status  07/01/2020 0.76 0.50 - 0.99 mg/dL Final    Comment:    For patients >9 years of age, the reference limit for Creatinine is approximately 13% higher for people identified as African-American. .           Failed - K in normal range and within 180 days    Potassium  Date Value Ref Range Status  07/01/2020 4.6 3.5 - 5.3 mmol/L Final          Failed - Last BP in normal range    BP Readings from Last 1 Encounters:  07/18/20 (!) 151/77          Failed - Valid encounter within last 6 months    Recent Outpatient Visits           8 months ago Annual physical exam   [facility], Lupita Raider, [place]   1 year ago Essential hypertension   [facility] Mikey College, NP   2 years ago Anxiety and depression   [facility] Merrilyn Puma, Jerrel Ivory, NP   2 years ago Welcome to [employer] preventive visit   [facility] Merrilyn Puma, Jerrel Ivory, NP   2 years ago Gastroesophageal reflux disease, esophagitis presence not specified   [facility] Mikey College, NP                Passed - Patient is not pregnant

## 2021-05-04 ENCOUNTER — Ambulatory Visit: Payer: Self-pay | Admitting: Internal Medicine

## 2021-05-04 NOTE — Progress Notes (Deleted)
Subjective:    Patient ID: Joyce Pace, female    DOB: 1951-03-13, 70 y.o.   MRN: [id number]  HPI  Patient presents the clinic today for follow-up of chronic conditions.  She is establishing care with me today, transferring care from Lonie Peak, NP.  HTN: Her BP today is.  She is taking Lisinopril as prescribed.  ECG from 06/2018 reviewed.  HLD with Aortic Atherosclerosis: Her last LDL was 127, triglycerides 117, 06/2020.  She denies myalgias on Simvastatin.  She tries to consume a low-fat diet.  GERD: Triggered by.  She denies breakthrough on Omeprazole.  Upper GI from 06/2018 reviewed.  History of Colon Cancer:  Anxiety and Depression: Chronic, managed on Paroxetine.  She is not currently seeing a therapist.  She denies SI/HI.  She also reports a mass of her right groin.  She noticed this.  Review of Systems   Past Medical History:  Diagnosis Date   Anemia    Anxiety    Colon cancer ([place]) 2014   Hypertension     Current Outpatient Medications  Medication Sig Dispense Refill   aspirin EC 81 MG tablet Take 1 tablet (81 mg total) by mouth daily. (Patient taking differently: Take 81 mg by mouth daily as needed.)     docusate sodium (COLACE) 100 MG capsule Take 100 mg by mouth daily as needed for mild constipation.     fluconazole (DIFLUCAN) 150 MG tablet Take one tablet by mouth on Day 1. Repeat dose 2nd tablet on Day 3. 2 tablet 0   lisinopril (ZESTRIL) 20 MG tablet TAKE 1 TABLET(20 MG) BY MOUTH DAILY 90 tablet 1   Misc Natural Products (COLON CLEANSE) CAPS Take 1 capsule by mouth daily as needed.     omeprazole (PRILOSEC) 20 MG capsule TAKE 1 CAPSULE(20 MG) BY MOUTH DAILY (Patient taking differently: Take 20 mg by mouth daily as needed.) 90 capsule 0   PARoxetine (PAXIL) 10 MG tablet Take 1 tablet (10 mg total) by mouth daily. 90 tablet 1   simvastatin (ZOCOR) 40 MG tablet TAKE 1 TABLET(40 MG) BY MOUTH AT BEDTIME 90 tablet 3   No current facility-administered  medications for this visit.    Allergies  Allergen Reactions   Flax Seed [Bio-Flax] Other (See Comments)    Throat gets itching and difficultly swallowing    Family History  Problem Relation Age of Onset   Hyperlipidemia Mother    Hypertension Mother    Diabetes Father    Hypertension Sister    Hyperlipidemia Sister    Hyperlipidemia Brother    Hypertension Brother    Ovarian cancer Daughter    Healthy Son    Heart disease Maternal Grandmother    Heart disease Maternal Grandfather    Heart disease Paternal Grandmother    Heart disease Paternal Grandfather    Hyperlipidemia Sister    Healthy Daughter     Social History   Socioeconomic History   Marital status: Married    Spouse name: Not on file   Number of children: Not on file   Years of education: Not on file   Highest education level: High school graduate  Occupational History   Occupation: retired  Tobacco Use   Smoking status: Never   Smokeless tobacco: Never  Vaping Use   Vaping Use: Never used  Substance and Sexual Activity   Alcohol use: No   Drug use: No   Sexual activity: Not Currently  Other Topics Concern   Not on file  Social  History Narrative   Not on file   Social Determinants of Health   Financial Resource Strain: Not on file  Food Insecurity: Not on file  Transportation Needs: Not on file  Physical Activity: Not on file  Stress: Not on file  Social Connections: Not on file  Intimate Partner Violence: Not on file     Constitutional: Denies fever, malaise, fatigue, headache or abrupt weight changes.  HEENT: Denies eye pain, eye redness, ear pain, ringing in the ears, wax buildup, runny nose, nasal congestion, bloody nose, or sore throat. Respiratory: Denies difficulty breathing, shortness of breath, cough or sputum production.   Cardiovascular: Denies chest pain, chest tightness, palpitations or swelling in the hands or feet.  Gastrointestinal: Denies abdominal pain, bloating,  constipation, diarrhea or blood in the stool.  GU: Denies urgency, frequency, pain with urination, burning sensation, blood in urine, odor or discharge. Musculoskeletal: Denies decrease in range of motion, difficulty with gait, muscle pain or joint pain and swelling.  Skin: Patient reports mass of right groin.  Denies redness, rashes, or ulcercations.  Neurological: Denies dizziness, difficulty with memory, difficulty with speech or problems with balance and coordination.  Psych: Patient has a history of anxiety and depression.  Denies SI/HI.  No other specific complaints in a complete review of systems (except as listed in HPI above).  Objective:   Physical Exam There were no vitals taken for this visit. Wt Readings from Last 3 Encounters:  07/18/20 145 lb 4.8 oz (65.9 kg)  07/01/20 143 lb 3.2 oz (65 kg)  03/22/20 177 lb 4.8 oz (80.4 kg)    General: Appears their stated age, well developed, well nourished in NAD. Skin: Warm, dry and intact. No rashes, lesions or ulcerations noted. HEENT: Head: normal shape and size; Eyes: sclera white, no icterus, conjunctiva pink, PERRLA and EOMs intact; Ears: Tm's gray and intact, normal light reflex; Nose: mucosa pink and moist, septum midline; Throat/Mouth: Teeth present, mucosa pink and moist, no exudate, lesions or ulcerations noted.  Neck:  Neck supple, trachea midline. No masses, lumps or thyromegaly present.  Cardiovascular: Normal rate and rhythm. S1,S2 noted.  No murmur, rubs or gallops noted. No JVD or BLE edema. No carotid bruits noted. Pulmonary/Chest: Normal effort and positive vesicular breath sounds. No respiratory distress. No wheezes, rales or ronchi noted.  Abdomen: Soft and nontender. Normal bowel sounds. No distention or masses noted. Liver, spleen and kidneys non palpable. Musculoskeletal: Normal range of motion. No signs of joint swelling. No difficulty with gait.  Neurological: Alert and oriented. Cranial nerves II-XII grossly  intact. Coordination normal.  Psychiatric: Mood and affect normal. Behavior is normal. Judgment and thought content normal.   BMET    Component Value Date/Time   NA 140 07/01/2020 1056   K 4.6 07/01/2020 1056   CL 104 07/01/2020 1056   CO2 30 07/01/2020 1056   GLUCOSE 94 07/01/2020 1056   BUN 14 07/01/2020 1056   CREATININE 0.76 07/01/2020 1056   CALCIUM 10.0 07/01/2020 1056   GFRNONAA 81 07/01/2020 1056   GFRAA 94 07/01/2020 1056    Lipid Panel     Component Value Date/Time   CHOL 209 (H) 07/01/2020 1056   TRIG 117 07/01/2020 1056   HDL 60 07/01/2020 1056   CHOLHDL 3.5 07/01/2020 1056   LDLCALC 127 (H) 07/01/2020 1056    CBC    Component Value Date/Time   WBC 4.8 07/18/2020 0933   RBC 5.05 07/18/2020 0933   HGB 12.5 07/18/2020 0933  HCT 38.7 07/18/2020 0933   PLT 197 07/18/2020 0933   MCV 76.6 (L) 07/18/2020 0933   MCH 24.8 (L) 07/18/2020 0933   MCHC 32.3 07/18/2020 0933   RDW 14.2 07/18/2020 0933   LYMPHSABS 1.5 07/18/2020 0933   MONOABS 0.4 07/18/2020 0933   EOSABS 0.3 07/18/2020 0933   BASOSABS 0.0 07/18/2020 0933    Hgb A1C No results found for: HGBA1C           Assessment & Plan:     Webb Silversmith, NP This visit occurred during the SARS-CoV-2 public health emergency.  Safety protocols were in place, including screening questions prior to the visit, additional usage of staff PPE, and extensive cleaning of exam room while observing appropriate contact time as indicated for disinfecting solutions.

## 2021-05-22 ENCOUNTER — Other Ambulatory Visit: Payer: Self-pay

## 2021-05-22 ENCOUNTER — Observation Stay: Payer: Medicare Other | Admitting: Anesthesiology

## 2021-05-22 ENCOUNTER — Encounter: Payer: Self-pay | Admitting: Emergency Medicine

## 2021-05-22 ENCOUNTER — Emergency Department: Payer: Medicare Other

## 2021-05-22 ENCOUNTER — Encounter
Admission: EM | Disposition: A | Discharge status: Home / Self Care | Payer: Self-pay | Source: Home / Self Care | Attending: Emergency Medicine

## 2021-05-22 ENCOUNTER — Observation Stay
Admission: EM | Admit: 2021-05-22 | Discharge: 2021-05-24 | Disposition: A | Discharge status: Home / Self Care | Payer: Medicare Other | Attending: General Surgery | Admitting: General Surgery

## 2021-05-22 DIAGNOSIS — K3580 Unspecified acute appendicitis: Secondary | ICD-10-CM | POA: Diagnosis present

## 2021-05-22 DIAGNOSIS — Z85038 Personal history of other malignant neoplasm of large intestine: Secondary | ICD-10-CM | POA: Insufficient documentation

## 2021-05-22 DIAGNOSIS — Z7982 Long term (current) use of aspirin: Secondary | ICD-10-CM | POA: Insufficient documentation

## 2021-05-22 DIAGNOSIS — Z20822 Contact with and (suspected) exposure to covid-19: Secondary | ICD-10-CM | POA: Insufficient documentation

## 2021-05-22 DIAGNOSIS — K353 Acute appendicitis with localized peritonitis, without perforation or gangrene: Principal | ICD-10-CM | POA: Insufficient documentation

## 2021-05-22 DIAGNOSIS — Z79899 Other long term (current) drug therapy: Secondary | ICD-10-CM | POA: Diagnosis not present

## 2021-05-22 DIAGNOSIS — R1084 Generalized abdominal pain: Secondary | ICD-10-CM | POA: Diagnosis present

## 2021-05-22 DIAGNOSIS — I10 Essential (primary) hypertension: Secondary | ICD-10-CM | POA: Insufficient documentation

## 2021-05-22 HISTORY — PX: LAPAROSCOPIC APPENDECTOMY: SHX408

## 2021-05-22 LAB — URINALYSIS, COMPLETE (UACMP) WITH MICROSCOPIC
Bilirubin Urine: NEGATIVE
Glucose, UA: NEGATIVE mg/dL
Ketones, ur: NEGATIVE mg/dL
Leukocytes,Ua: NEGATIVE
Nitrite: NEGATIVE
Protein, ur: NEGATIVE mg/dL
Specific Gravity, Urine: 1.003 — ABNORMAL LOW (ref 1.005–1.030)
Squamous Epithelial / HPF: NONE SEEN (ref 0–5)
pH: 6 (ref 5.0–8.0)

## 2021-05-22 LAB — COMPREHENSIVE METABOLIC PANEL
ALT: 14 U/L (ref 0–44)
AST: 19 U/L (ref 15–41)
Albumin: 4.1 g/dL (ref 3.5–5.0)
Alkaline Phosphatase: 89 U/L (ref 38–126)
Anion gap: 8 (ref 5–15)
BUN: 11 mg/dL (ref 8–23)
CO2: 26 mmol/L (ref 22–32)
Calcium: 9.1 mg/dL (ref 8.9–10.3)
Chloride: 100 mmol/L (ref 98–111)
Creatinine, Ser: 0.64 mg/dL (ref 0.44–1.00)
GFR, Estimated: >60 mL/min (ref >60)
Glucose, Bld: 103 mg/dL — ABNORMAL HIGH (ref 70–99)
Potassium: 4.1 mmol/L (ref 3.5–5.1)
Sodium: 134 mmol/L — ABNORMAL LOW (ref 135–145)
Total Bilirubin: 1.6 mg/dL — ABNORMAL HIGH (ref 0.3–1.2)
Total Protein: 7.6 g/dL (ref 6.5–8.1)

## 2021-05-22 LAB — CBC
HCT: 39.7 % (ref 36.0–46.0)
Hemoglobin: 12.7 g/dL (ref 12.0–15.0)
MCH: 24.9 pg — ABNORMAL LOW (ref 26.0–34.0)
MCHC: 32 g/dL (ref 30.0–36.0)
MCV: 77.7 fL — ABNORMAL LOW (ref 80.0–100.0)
Platelets: 205 10*3/uL (ref 150–400)
RBC: 5.11 MIL/uL (ref 3.87–5.11)
RDW: 13.9 % (ref 11.5–15.5)
WBC: 7.6 10*3/uL (ref 4.0–10.5)
nRBC: 0 % (ref 0.0–0.2)

## 2021-05-22 LAB — RESP PANEL BY RT-PCR (FLU A&B, COVID) ARPGX2
Influenza A by PCR: NEGATIVE
Influenza B by PCR: NEGATIVE
SARS Coronavirus 2 by RT PCR: NEGATIVE

## 2021-05-22 LAB — LIPASE, BLOOD: Lipase: 27 U/L (ref 11–51)

## 2021-05-22 SURGERY — APPENDECTOMY, LAPAROSCOPIC
Anesthesia: General

## 2021-05-22 MED ORDER — LORAZEPAM 2 MG/ML IJ SOLN
0.5000 mg | Freq: Once | INTRAMUSCULAR | Status: AC
  Administered 2021-05-22: 0.25 mg via INTRAVENOUS
  Filled 2021-05-22: qty 1

## 2021-05-22 MED ORDER — LIDOCAINE HCL (PF) 2 % IJ SOLN
INTRAMUSCULAR | Status: AC
  Filled 2021-05-22: qty 5

## 2021-05-22 MED ORDER — FENTANYL CITRATE (PF) 100 MCG/2ML IJ SOLN
25.0000 ug | INTRAMUSCULAR | Status: DC | PRN
  Administered 2021-05-23 (×3): 25 ug via INTRAVENOUS

## 2021-05-22 MED ORDER — DEXAMETHASONE SODIUM PHOSPHATE 10 MG/ML IJ SOLN
INTRAMUSCULAR | Status: DC | PRN
  Administered 2021-05-22: 8 mg via INTRAVENOUS

## 2021-05-22 MED ORDER — MIDAZOLAM HCL 2 MG/2ML IJ SOLN
INTRAMUSCULAR | Status: DC | PRN
  Administered 2021-05-22: 2 mg via INTRAVENOUS

## 2021-05-22 MED ORDER — PANTOPRAZOLE SODIUM 40 MG PO TBEC
40.0000 mg | DELAYED_RELEASE_TABLET | Freq: Every day | ORAL | Status: DC
  Administered 2021-05-23 – 2021-05-24 (×2): 40 mg via ORAL
  Filled 2021-05-22 (×2): qty 1

## 2021-05-22 MED ORDER — ALBUTEROL SULFATE HFA 108 (90 BASE) MCG/ACT IN AERS
INHALATION_SPRAY | RESPIRATORY_TRACT | Status: AC
  Filled 2021-05-22: qty 6.7

## 2021-05-22 MED ORDER — ONDANSETRON HCL 4 MG/2ML IJ SOLN
INTRAMUSCULAR | Status: AC
  Filled 2021-05-22: qty 4

## 2021-05-22 MED ORDER — FENTANYL CITRATE (PF) 100 MCG/2ML IJ SOLN: INTRAMUSCULAR | Status: DC | PRN

## 2021-05-22 MED ORDER — PAROXETINE HCL 10 MG PO TABS
10.0000 mg | ORAL_TABLET | Freq: Every day | ORAL | Status: DC
  Administered 2021-05-24: 10 mg via ORAL
  Filled 2021-05-22 (×2): qty 1

## 2021-05-22 MED ORDER — LIDOCAINE HCL (CARDIAC) PF 100 MG/5ML IV SOSY
PREFILLED_SYRINGE | INTRAVENOUS | Status: DC | PRN
  Administered 2021-05-22: 80 mg via INTRAVENOUS

## 2021-05-22 MED ORDER — OXYCODONE HCL 5 MG PO TABS: 5.0000 mg | ORAL_TABLET | Freq: Once | ORAL | Status: AC | PRN

## 2021-05-22 MED ORDER — PHENYLEPHRINE HCL (PRESSORS) 10 MG/ML IV SOLN
INTRAVENOUS | Status: DC | PRN
  Administered 2021-05-22 (×2): 100 ug via INTRAVENOUS

## 2021-05-22 MED ORDER — FENTANYL CITRATE (PF) 100 MCG/2ML IJ SOLN
INTRAMUSCULAR | Status: AC
  Filled 2021-05-22: qty 2

## 2021-05-22 MED ORDER — OXYCODONE HCL 5 MG/5ML PO SOLN
5.0000 mg | Freq: Once | ORAL | Status: AC | PRN
Start: 2021-05-22 — End: 2021-05-23

## 2021-05-22 MED ORDER — LACTATED RINGERS IV SOLN: INTRAVENOUS | Status: DC | PRN

## 2021-05-22 MED ORDER — KETOROLAC TROMETHAMINE 15 MG/ML IJ SOLN
15.0000 mg | Freq: Four times a day (QID) | INTRAMUSCULAR | Status: DC | PRN
  Administered 2021-05-23 – 2021-05-24 (×2): 15 mg via INTRAVENOUS
  Filled 2021-05-22 (×3): qty 1

## 2021-05-22 MED ORDER — ACETAMINOPHEN 10 MG/ML IV SOLN
INTRAVENOUS | Status: AC
  Filled 2021-05-22: qty 100

## 2021-05-22 MED ORDER — PIPERACILLIN-TAZOBACTAM 3.375 G IVPB
3.3750 g | Freq: Three times a day (TID) | INTRAVENOUS | Status: DC
  Administered 2021-05-22 – 2021-05-24 (×5): 3.375 g via INTRAVENOUS
  Filled 2021-05-22 (×4): qty 50

## 2021-05-22 MED ORDER — IOHEXOL 300 MG/ML  SOLN
75.0000 mL | Freq: Once | INTRAMUSCULAR | Status: AC | PRN
  Administered 2021-05-22: 75 mL via INTRAVENOUS
  Filled 2021-05-22: qty 75

## 2021-05-22 MED ORDER — PROMETHAZINE HCL 25 MG/ML IJ SOLN: 6.2500 mg | INTRAMUSCULAR | Status: DC | PRN

## 2021-05-22 MED ORDER — SODIUM CHLORIDE 0.9 % IV SOLN: Freq: Once | INTRAVENOUS | Status: AC

## 2021-05-22 MED ORDER — EPHEDRINE 5 MG/ML INJ
INTRAVENOUS | Status: AC
  Filled 2021-05-22: qty 10

## 2021-05-22 MED ORDER — ONDANSETRON HCL 4 MG/2ML IJ SOLN
INTRAMUSCULAR | Status: DC | PRN
  Administered 2021-05-22: 4 mg via INTRAVENOUS

## 2021-05-22 MED ORDER — ONDANSETRON HCL 4 MG/2ML IJ SOLN: 4.0000 mg | Freq: Four times a day (QID) | INTRAMUSCULAR | Status: DC | PRN

## 2021-05-22 MED ORDER — HYDROMORPHONE HCL 1 MG/ML IJ SOLN
0.5000 mg | INTRAMUSCULAR | Status: DC | PRN
Start: 2021-05-22 — End: 2021-05-25

## 2021-05-22 MED ORDER — BUPIVACAINE HCL (PF) 0.25 % IJ SOLN
INTRAMUSCULAR | Status: AC
  Filled 2021-05-22: qty 30

## 2021-05-22 MED ORDER — ACETAMINOPHEN 500 MG PO TABS
1000.0000 mg | ORAL_TABLET | Freq: Four times a day (QID) | ORAL | Status: DC
  Administered 2021-05-22 – 2021-05-24 (×5): 1000 mg via ORAL
  Filled 2021-05-22 (×6): qty 2

## 2021-05-22 MED ORDER — LIDOCAINE-EPINEPHRINE 1 %-1:100000 IJ SOLN
INTRAMUSCULAR | Status: DC | PRN
  Administered 2021-05-22: 25 mL

## 2021-05-22 MED ORDER — FENTANYL CITRATE (PF) 100 MCG/2ML IJ SOLN
INTRAMUSCULAR | Status: DC | PRN
  Administered 2021-05-22: 100 ug via INTRAVENOUS

## 2021-05-22 MED ORDER — ONDANSETRON 4 MG PO TBDP: 4.0000 mg | ORAL_TABLET | Freq: Four times a day (QID) | ORAL | Status: DC | PRN

## 2021-05-22 MED ORDER — LIDOCAINE-EPINEPHRINE 1 %-1:100000 IJ SOLN
INTRAMUSCULAR | Status: AC
  Filled 2021-05-22: qty 1

## 2021-05-22 MED ORDER — SUGAMMADEX SODIUM 200 MG/2ML IV SOLN
INTRAVENOUS | Status: DC | PRN
  Administered 2021-05-22: 300 mg via INTRAVENOUS

## 2021-05-22 MED ORDER — SUCCINYLCHOLINE CHLORIDE 200 MG/10ML IV SOSY
PREFILLED_SYRINGE | INTRAVENOUS | Status: AC
  Filled 2021-05-22: qty 10

## 2021-05-22 MED ORDER — ROCURONIUM BROMIDE 100 MG/10ML IV SOLN
INTRAVENOUS | Status: DC | PRN
  Administered 2021-05-22: 50 mg via INTRAVENOUS

## 2021-05-22 MED ORDER — MIDAZOLAM HCL 2 MG/2ML IJ SOLN
INTRAMUSCULAR | Status: AC
  Filled 2021-05-22: qty 2

## 2021-05-22 MED ORDER — PROPOFOL 10 MG/ML IV BOLUS
INTRAVENOUS | Status: DC | PRN
  Administered 2021-05-22: 130 mg via INTRAVENOUS

## 2021-05-22 SURGICAL SUPPLY — 52 items
APPLICATOR COTTON TIP 6 STRL (MISCELLANEOUS) ×1 IMPLANT
APPLICATOR COTTON TIP 6IN STRL (MISCELLANEOUS) ×2 IMPLANT
APPLIER CLIP 5 13 M/L LIGAMAX5 (MISCELLANEOUS)
BLADE CLIPPER SURG (BLADE) IMPLANT
BLADE SURG SZ11 CARB STEEL (BLADE) ×2 IMPLANT
CHLORAPREP W/TINT 26 (MISCELLANEOUS) ×2 IMPLANT
CLIP APPLIE 5 13 M/L LIGAMAX5 (MISCELLANEOUS) IMPLANT
CUTTER FLEX LINEAR 45M (STAPLE) ×2 IMPLANT
DEFOGGER SCOPE WARMER CLEARIFY (MISCELLANEOUS) ×2 IMPLANT
DERMABOND ADVANCED (GAUZE/BANDAGES/DRESSINGS) ×1
DERMABOND ADVANCED .7 DNX12 (GAUZE/BANDAGES/DRESSINGS) ×1 IMPLANT
ELECT CAUTERY BLADE TIP 2.5 (TIP) ×2
ELECT REM PT RETURN 9FT ADLT (ELECTROSURGICAL) ×2
ELECTRODE CAUTERY BLDE TIP 2.5 (TIP) ×1 IMPLANT
ELECTRODE REM PT RTRN 9FT ADLT (ELECTROSURGICAL) ×1 IMPLANT
GAUZE 4X4 16PLY ~~LOC~~+RFID DBL (SPONGE) ×2 IMPLANT
GLOVE SURG SYN 6.5 ES PF (GLOVE) ×4 IMPLANT
GLOVE SURG UNDER LTX SZ7 (GLOVE) ×4 IMPLANT
GOWN STRL REUS W/ TWL LRG LVL3 (GOWN DISPOSABLE) ×2 IMPLANT
GOWN STRL REUS W/TWL LRG LVL3 (GOWN DISPOSABLE) ×2
GRASPER SUT TROCAR 14GX15 (MISCELLANEOUS) ×2 IMPLANT
IRRIGATION STRYKERFLOW (MISCELLANEOUS) ×1 IMPLANT
IRRIGATOR STRYKERFLOW (MISCELLANEOUS) ×2
IV NS 1000ML (IV SOLUTION) ×1
IV NS 1000ML BAXH (IV SOLUTION) ×1 IMPLANT
KIT TURNOVER KIT A (KITS) ×2 IMPLANT
KITTNER LAPARASCOPIC 5X40 (MISCELLANEOUS) ×2 IMPLANT
LABEL OR SOLS (LABEL) ×2 IMPLANT
MANIFOLD NEPTUNE II (INSTRUMENTS) ×2 IMPLANT
NEEDLE HYPO 22GX1.5 SAFETY (NEEDLE) ×2 IMPLANT
NS IRRIG 500ML POUR BTL (IV SOLUTION) ×2 IMPLANT
PACK LAP CHOLECYSTECTOMY (MISCELLANEOUS) ×2 IMPLANT
PENCIL ELECTRO HAND CTR (MISCELLANEOUS) ×2 IMPLANT
POUCH SPECIMEN RETRIEVAL 10MM (ENDOMECHANICALS) ×2 IMPLANT
RELOAD STAPLE TA45 3.5 REG BLU (ENDOMECHANICALS) ×2 IMPLANT
SCISSORS METZENBAUM CVD 33 (INSTRUMENTS) ×2 IMPLANT
SET TUBE SMOKE EVAC HIGH FLOW (TUBING) ×2 IMPLANT
SHEARS HARMONIC ACE PLUS 36CM (ENDOMECHANICALS) ×2 IMPLANT
SLEEVE ADV FIXATION 5X100MM (TROCAR) ×2 IMPLANT
SLEEVE SCD COMPRESS KNEE MED (STOCKING) ×2 IMPLANT
STRIP CLOSURE SKIN 1/2X4 (GAUZE/BANDAGES/DRESSINGS) ×2 IMPLANT
SUT MNCRL 4-0 (SUTURE) ×1
SUT MNCRL 4-0 27XMFL (SUTURE) ×1
SUT VIC AB 3-0 SH 27 (SUTURE) ×1
SUT VIC AB 3-0 SH 27X BRD (SUTURE) ×1 IMPLANT
SUT VICRYL 0 AB UR-6 (SUTURE) ×2 IMPLANT
SUTURE MNCRL 4-0 27XMF (SUTURE) ×1 IMPLANT
SYS KII FIOS ACCESS ABD 5X100 (TROCAR) ×2
SYSTEM KII FIOS ACES ABD 5X100 (TROCAR) ×1 IMPLANT
TRAY FOLEY MTR SLVR 16FR STAT (SET/KITS/TRAYS/PACK) ×2 IMPLANT
TROCAR ADV FIXATION 12X100MM (TROCAR) IMPLANT
TROCAR BALLN GELPORT 12X130M (ENDOMECHANICALS) ×2 IMPLANT

## 2021-05-22 NOTE — ED Notes (Signed)
Attempted 20g Iv at L ac; attempted 22g at L upper fa. Will have 2nd RN try.

## 2021-05-22 NOTE — Anesthesia Preprocedure Evaluation (Signed)
Anesthesia Evaluation  Patient identified by MRN, date of birth, ID band Patient awake    Reviewed: Allergy & Precautions, NPO status , Patient's Chart, lab work & pertinent test results  History of Anesthesia Complications Negative for: history of anesthetic complications  Airway Mallampati: III  TM Distance: >3 FB Neck ROM: full    Dental  (+) Chipped   Pulmonary neg pulmonary ROS, neg shortness of breath,    Pulmonary exam normal        Cardiovascular Exercise Tolerance: Good hypertension, (-) angina(-) Past MI and (-) DOE Normal cardiovascular exam     Neuro/Psych PSYCHIATRIC DISORDERS negative neurological ROS     GI/Hepatic negative GI ROS, Neg liver ROS, neg GERD  ,  Endo/Other  negative endocrine ROS  Renal/GU      Musculoskeletal   Abdominal   Peds  Hematology negative hematology ROS (+)   Anesthesia Other Findings Past Medical History: No date: Anemia No date: Anxiety 2014: Colon cancer ([place]) No date: Hypertension  Past Surgical History: No date: CHOLECYSTECTOMY 2016: COLON SURGERY     Comment:  colon resection 06/12/2018: COLONOSCOPY WITH PROPOFOL; N/A     Comment:  Procedure: COLONOSCOPY WITH PROPOFOL;  Surgeon:               Virgel Manifold, MD;  Location: [facility] ENDOSCOPY;                Service: Endoscopy;  Laterality: N/A; 06/12/2018: ESOPHAGOGASTRODUODENOSCOPY (EGD) WITH PROPOFOL; N/A     Comment:  Procedure: ESOPHAGOGASTRODUODENOSCOPY (EGD) WITH               PROPOFOL;  Surgeon: Virgel Manifold, MD;  Location:               [facility] ENDOSCOPY;  Service: Endoscopy;  Laterality: N/A;  BMI    Body Mass Index: 24.18 kg/m      Reproductive/Obstetrics negative OB ROS                             Anesthesia Physical Anesthesia Plan  ASA: 2  Anesthesia Plan: General ETT   Post-op Pain Management:    Induction: Intravenous  PONV Risk Score and Plan:  Ondansetron, Dexamethasone, Midazolam and Treatment may vary due to age or medical condition  Airway Management Planned: Oral ETT  Additional Equipment:   Intra-op Plan:   Post-operative Plan: Extubation in OR  Informed Consent: I have reviewed the patients History and Physical, chart, labs and discussed the procedure including the risks, benefits and alternatives for the proposed anesthesia with the patient or authorized representative who has indicated his/her understanding and acceptance.     Dental Advisory Given  Plan Discussed with: Anesthesiologist, CRNA and Surgeon  Anesthesia Plan Comments: (Patient consented for risks of anesthesia including but not limited to:  - adverse reactions to medications - damage to eyes, teeth, lips or other oral mucosa - nerve damage due to positioning  - sore throat or hoarseness - Damage to heart, brain, nerves, lungs, other parts of body or loss of life  Patient voiced understanding.)        Anesthesia Quick Evaluation

## 2021-05-22 NOTE — Anesthesia Procedure Notes (Signed)
Procedure Name: Intubation Date/Time: 05/22/2021 10:59 PM Performed by: Lerry Liner, CRNA Pre-anesthesia Checklist: Patient identified, Emergency Drugs available, Suction available and Patient being monitored Patient Re-evaluated:Patient Re-evaluated prior to induction Oxygen Delivery Method: Circle system utilized Preoxygenation: Pre-oxygenation with 100% oxygen Induction Type: IV induction Ventilation: Mask ventilation without difficulty Laryngoscope Size: McGraph and 3 Grade View: Grade I Tube type: Oral Tube size: 7.0 mm Number of attempts: 1 Airway Equipment and Method: Stylet and Oral airway Placement Confirmation: ETT inserted through vocal cords under direct vision, positive ETCO2 and breath sounds checked- equal and bilateral Secured at: 21 cm Tube secured with: Tape Dental Injury: Teeth and Oropharynx as per pre-operative assessment

## 2021-05-22 NOTE — ED Notes (Signed)
Patient to OR with OR RN.

## 2021-05-22 NOTE — ED Notes (Signed)
Patient and son updated on inpatient room assignment, and inpatient visitation hours. Patient updated on POC.

## 2021-05-22 NOTE — ED Provider Notes (Signed)
[facility] Emergency Department Provider Note ____________________________________________   Event Date/Time   First MD Initiated Contact with Patient 05/22/21 1616     (approximate)  I have reviewed the triage vital signs and the nursing notes.   HISTORY  Chief Complaint Abdominal Pain  HPI Joyce Pace is a 70 y.o. female with history of anxiety, hypertension, colon cancer, anxiety and depression presents to the emergency department for treatment and evaluation of generalized abdominal discomfort.  She had been constipated and took laxatives 2 days ago.  She had several bowel movements afterward but states that she has continued to have gas and bloating.  She states that she is very anxious due to recent loss of spouse and mother.     Past Medical History:  Diagnosis Date   Anemia    Anxiety    Colon cancer ([place]) 2014   Hypertension     Patient Active Problem List   Diagnosis Date Noted   Barrett's esophagus without dysplasia    Essential hypertension 02/19/2018   Hyperlipidemia 02/19/2018   History of colon cancer 02/19/2018   Anxiety and depression 02/19/2018    Past Surgical History:  Procedure Laterality Date   CHOLECYSTECTOMY     COLON SURGERY  2016   colon resection   COLONOSCOPY WITH PROPOFOL N/A 06/12/2018   Procedure: COLONOSCOPY WITH PROPOFOL;  Surgeon: Virgel Manifold, MD;  Location: [facility] ENDOSCOPY;  Service: Endoscopy;  Laterality: N/A;   ESOPHAGOGASTRODUODENOSCOPY (EGD) WITH PROPOFOL N/A 06/12/2018   Procedure: ESOPHAGOGASTRODUODENOSCOPY (EGD) WITH PROPOFOL;  Surgeon: Virgel Manifold, MD;  Location: [facility] ENDOSCOPY;  Service: Endoscopy;  Laterality: N/A;    Prior to Admission medications   Medication Sig Start Date End Date Taking? Authorizing Provider  aspirin EC 81 MG tablet Take 1 tablet (81 mg total) by mouth daily. Patient taking differently: Take 81 mg by mouth daily as needed. 12/11/18   Mikey College, NP  docusate sodium (COLACE) 100 MG capsule Take 100 mg by mouth daily as needed for mild constipation.    [provider]  fluconazole (DIFLUCAN) 150 MG tablet Take one tablet by mouth on Day 1. Repeat dose 2nd tablet on Day 3. 01/11/21   Parks Ranger, Devonne Doughty, DO  lisinopril (ZESTRIL) 20 MG tablet TAKE 1 TABLET(20 MG) BY MOUTH DAILY 02/03/21   Karamalegos, Devonne Doughty, DO  Misc Natural Products (COLON CLEANSE) CAPS Take 1 capsule by mouth daily as needed.    [provider]  omeprazole (PRILOSEC) 20 MG capsule TAKE 1 CAPSULE(20 MG) BY MOUTH DAILY Patient taking differently: Take 20 mg by mouth daily as needed. 11/03/19   Karamalegos, Devonne Doughty, DO  PARoxetine (PAXIL) 10 MG tablet Take 1 tablet (10 mg total) by mouth daily. 01/27/21   Parks Ranger, Devonne Doughty, DO  simvastatin (ZOCOR) 40 MG tablet TAKE 1 TABLET(40 MG) BY MOUTH AT BEDTIME 01/27/21   Karamalegos, Devonne Doughty, DO    Allergies Flax seed [bio-flax]  Family History  Problem Relation Age of Onset   Hyperlipidemia Mother    Hypertension Mother    Diabetes Father    Hypertension Sister    Hyperlipidemia Sister    Hyperlipidemia Brother    Hypertension Brother    Ovarian cancer Daughter    Healthy Son    Heart disease Maternal Grandmother    Heart disease Maternal Grandfather    Heart disease Paternal Grandmother    Heart disease Paternal Grandfather    Hyperlipidemia Sister    Healthy Daughter  Social History Social History   Tobacco Use   Smoking status: Never   Smokeless tobacco: Never  Vaping Use   Vaping Use: Never used  Substance Use Topics   Alcohol use: No   Drug use: No    Review of Systems  Constitutional: No fever/chills Eyes: No visual changes. ENT: No sore throat. Cardiovascular: Denies chest pain. Respiratory: Denies shortness of breath. Gastrointestinal: Positive for abdominal pain, gas, and bloating. Positive for diarrhea after eating.  Genitourinary: Negative for  dysuria. Musculoskeletal: Negative for back pain. Skin: Negative for rash. Neurological: Negative for headaches, focal weakness or numbness. ____________________________________________   PHYSICAL EXAM:  VITAL SIGNS: ED Triage Vitals  Enc Vitals Group     BP 05/22/21 1527 (!) 193/83     Pulse Rate 05/22/21 1527 75     Resp 05/22/21 1527 16     Temp 05/22/21 1527 98.1 F (36.7 C)     Temp Source 05/22/21 1527 Oral     SpO2 05/22/21 1527 97 %     Weight 05/22/21 1525 145 lb 4.5 oz (65.9 kg)     Height 05/22/21 1525 5\' 5"  (1.651 m)     Head Circumference --      Peak Flow --      Pain Score 05/22/21 1524 7     Pain Loc --      Pain Edu? --      Excl. in [place]? --     Constitutional: Alert and oriented. Well appearing and in no acute distress. Eyes: Conjunctivae are normal. PERRL. EOMI. Head: Atraumatic. Nose: No congestion/rhinnorhea. Mouth/Throat: Mucous membranes are moist.  Oropharynx non-erythematous. Neck: No stridor.   Hematological/Lymphatic/Immunilogical: No cervical lymphadenopathy. Cardiovascular: Normal rate, regular rhythm. Grossly normal heart sounds.  Good peripheral circulation. Respiratory: Normal respiratory effort.  No retractions. Lungs CTAB. Gastrointestinal: Soft and tender in right lower quadrant and suprapubic area. No distention. No abdominal bruits. No CVA tenderness. Genitourinary:  Musculoskeletal: No lower extremity tenderness nor edema.  No joint effusions. Neurologic:  Normal speech and language. No gross focal neurologic deficits are appreciated. No gait instability. Skin:  Skin is warm, dry and intact. No rash noted. Psychiatric: Mood and affect are normal. Speech and behavior are normal.  ____________________________________________   LABS (all labs ordered are listed, but only abnormal results are displayed)  Labs Reviewed  COMPREHENSIVE METABOLIC PANEL - Abnormal; Notable for the following components:      Result Value   Sodium 134 (*)     Glucose, Bld 103 (*)    Total Bilirubin 1.6 (*)    All other components within normal limits  CBC - Abnormal; Notable for the following components:   MCV 77.7 (*)    MCH 24.9 (*)    All other components within normal limits  URINALYSIS, COMPLETE (UACMP) WITH MICROSCOPIC - Abnormal; Notable for the following components:   Color, Urine STRAW (*)    APPearance CLEAR (*)    Specific Gravity, Urine 1.003 (*)    Hgb urine dipstick MODERATE (*)    Bacteria, UA RARE (*)    All other components within normal limits  RESP PANEL BY RT-PCR (FLU A&B, COVID) ARPGX2  LIPASE, BLOOD   ____________________________________________  EKG  ____________________________________________  RADIOLOGY  ED MD interpretation:    CT of the abdomen pelvis with contrast shows 10 mm appendix with periappendiceal inflammation and stranding with a 3 mm appendicolith at the base.  Incidental finding of a hypodensity measuring 3 mm on the right hepatic dome.  I, Sherrie George, personally viewed and evaluated these images (plain radiographs) as part of my medical decision making, as well as reviewing the written report by the radiologist.  Official radiology report(s): CT ABDOMEN PELVIS W CONTRAST  Result Date: 05/22/2021 CLINICAL DATA:  Constipation, unspecified abdominal EXAM: CT ABDOMEN AND PELVIS WITH CONTRAST TECHNIQUE: Multidetector CT imaging of the abdomen and pelvis was performed using the standard protocol following bolus administration of intravenous contrast. CONTRAST:  15mL OMNIPAQUE IOHEXOL 300 MG/ML  SOLN COMPARISON:  None. FINDINGS: Lower chest: No acute abnormality. Hepatobiliary: Tiny hypodensity within the right hepatic dome is too small to accurately characterize, measuring 3 mm, but may represent a tiny cyst or hemangioma in a patient without a history of malignancy. No other focal liver abnormality is seen. Status post cholecystectomy. No biliary dilatation. Pancreas: Unremarkable Spleen:  Unremarkable Adrenals/Urinary Tract: Adrenal glands are unremarkable. Kidneys are normal, without renal calculi, focal lesion, or hydronephrosis. Bladder is unremarkable. Stomach/Bowel: The appendix is thickened, measuring 10 mm in diameter distally, demonstrates mild hyperemia, mild periappendiceal inflammatory stranding, and a a 3 mm appendicoliths at the base of the appendix in keeping with changes of acute, unruptured, early appendicitis. The appendix is located inferior and medial to the cecum. No evidence of obstruction. No loculated intra-abdominal fluid collections. No free intraperitoneal gas or fluid. Surgical changes of distal colectomy are identified with a a colorectal anastomotic staple line noted. The stomach, small bowel, and large bowel are otherwise unremarkable. Vascular/Lymphatic: No significant vascular findings are present. No enlarged abdominal or pelvic lymph nodes. Reproductive: Uterus and bilateral adnexa are unremarkable. Other: Tiny fat containing umbilical and bilateral inguinal hernias, left greater than right. Rectum unremarkable. Musculoskeletal: Degenerative changes are seen within the lumbar spine. No acute bone abnormality. No lytic or blastic bone lesion identified. IMPRESSION: Unruptured appendicitis. Relatively mild inflammatory changes may reflect early acute appendicitis or a subacute to chronic inflammatory process. Appendix: Location: Inferomedial to cecum Diameter: 10 mm Appendicolith: Present, 3 mm Mucosal hyper-enhancement: Present Extraluminal gas: None Periappendiceal collection: None Electronically Signed   By: Fidela Salisbury MD   On: 05/22/2021 18:08    ____________________________________________   PROCEDURES  Procedure(s) performed (including Critical Care):  Procedures  ____________________________________________   INITIAL IMPRESSION / ASSESSMENT AND PLAN     70 year old female presenting to the emergency department for treatment and evaluation of  abdominal pain and bloating after an episode of constipation which was relieved with over-the-counter laxatives.  See HPI for further details.  Labs drawn while awaiting ER room assignment are overall reassuring.  She has a normal white count, lipase is normal, electrolytes are normal, urinalysis only shows some rare bacteria without white blood cells, leukocytes, or nitrates.  She has a very mild elevation of her total bilirubin at 1.6.  Plan will be to get a CT of the abdomen and pelvis to rule out any concerns.  DIFFERENTIAL DIAGNOSIS  Colitis, gastritis, medication side effect  ED COURSE  Results discussed with the patient and granddaughter.  Patient last ate at 10 AM but has had some sips of water since that time. Patient reports feeling very anxious--IV ativan ordered.  Case discussed with Dr. Celine Ahr who will come and evaluate the patient for surgical intervention.  COVID swab is pending.    ___________________________________________   FINAL CLINICAL IMPRESSION(S) / ED DIAGNOSES  Final diagnoses:  Acute appendicitis with localized peritonitis, without perforation, abscess, or gangrene     ED Discharge Orders     None  Joyce Pace was evaluated in Emergency Department on 05/22/2021 for the symptoms described in the history of present illness. She was evaluated in the context of the global COVID-19 pandemic, which necessitated consideration that the patient might be at risk for infection with the SARS-CoV-2 virus that causes COVID-19. Institutional protocols and algorithms that pertain to the evaluation of patients at risk for COVID-19 are in a state of rapid change based on information released by regulatory bodies including the CDC and federal and state organizations. These policies and algorithms were followed during the patient's care in the ED.   Note:  This document was prepared using Dragon voice recognition software and may include unintentional dictation  errors.    Victorino Dike, FNP 05/22/21 1926    Lucrezia Starch, MD 05/22/21 2136

## 2021-05-22 NOTE — ED Triage Notes (Addendum)
States was feeling constipated, took laxatives Saturday night, had good results.  Since then, generalized abdominal discomfort and gas.  Patient is tearful in triage.  States recent loss of spouse and mother and says that she is scared and anxious at this time.  Reassurance given and well received.  Continue to monitor.

## 2021-05-22 NOTE — ED Notes (Signed)
See triage note. Pt had constipation last few days; took milk of mag; had several BMs; states since then abd has been uncomfortable and when she eats that it gets worse. Pt resting calmly on stretcher; skin dry; resp reg/unlabored; denies fever, CP, SOB. Tender at mid/upper medial abdomen. Reports lots of gas.

## 2021-05-22 NOTE — ED Notes (Signed)
Patient is resting comfortably. 

## 2021-05-22 NOTE — ED Notes (Signed)
Pt given non-slip socks as feet were cold.

## 2021-05-22 NOTE — H&P (Signed)
Reason for Consult: Acute appendicitis Referring Physician: Sherrie George, FNP (emergency medicine)  Joyce Pace is an 70 y.o. female.  HPI: She has a past medical history notable for colon cancer status post laparoscopic partial colectomy with subsequent chemotherapy in 2014, anxiety, and hypertension.  She reports that on Saturday she felt constipated and took some milk of magnesia.  This did result in a bowel movement, but she continues to feel bloated and have abdominal discomfort.  Over the past 24 hours, the pain became more pronounced in her right lower quadrant.  She presented to the emergency department for further evaluation.  She denies any fevers or chills.  No nausea or vomiting.  Work-up included labs that did not show an elevation in her white blood cell count, but a CT scan of the abdomen and pelvis was significant for early acute appendicitis.  General surgery has been consulted in this context for further evaluation and management.  Past Medical History:  Diagnosis Date   Anemia    Anxiety    Colon cancer ([place]) 2014   Hypertension     Past Surgical History:  Procedure Laterality Date   CHOLECYSTECTOMY     COLON SURGERY  2016   colon resection   COLONOSCOPY WITH PROPOFOL N/A 06/12/2018   Procedure: COLONOSCOPY WITH PROPOFOL;  Surgeon: Virgel Manifold, MD;  Location: [facility] ENDOSCOPY;  Service: Endoscopy;  Laterality: N/A;   ESOPHAGOGASTRODUODENOSCOPY (EGD) WITH PROPOFOL N/A 06/12/2018   Procedure: ESOPHAGOGASTRODUODENOSCOPY (EGD) WITH PROPOFOL;  Surgeon: Virgel Manifold, MD;  Location: [facility] ENDOSCOPY;  Service: Endoscopy;  Laterality: N/A;    Family History  Problem Relation Age of Onset   Hyperlipidemia Mother    Hypertension Mother    Diabetes Father    Hypertension Sister    Hyperlipidemia Sister    Hyperlipidemia Brother    Hypertension Brother    Ovarian cancer Daughter    Healthy Son    Heart disease Maternal Grandmother    Heart disease  Maternal Grandfather    Heart disease Paternal Grandmother    Heart disease Paternal Grandfather    Hyperlipidemia Sister    Healthy Daughter     Social History:  reports that she has never smoked. She has never used smokeless tobacco. She reports that she does not drink alcohol and does not use drugs.  Allergies:  Allergies  Allergen Reactions   Flax Seed [Bio-Flax] Other (See Comments)    Throat gets itching and difficultly swallowing    Medications: I have reviewed the patient's current medications.  Results for orders placed or performed during the hospital encounter of 05/22/21 (from the past 48 hour(s))  Lipase, blood     Status: None   Collection Time: 05/22/21  3:31 PM  Result Value Ref Range   Lipase 27 11 - 51 U/L    Comment: Performed at [facility] [address] [place], [place] [address]  Comprehensive metabolic panel     Status: Abnormal   Collection Time: 05/22/21  3:31 PM  Result Value Ref Range   Sodium 134 (L) 135 - 145 mmol/L   Potassium 4.1 3.5 - 5.1 mmol/L   Chloride 100 98 - 111 mmol/L   CO2 26 22 - 32 mmol/L   Glucose, Bld 103 (H) 70 - 99 mg/dL    Comment: Glucose reference range applies only to samples taken after fasting for at least 8 hours.   BUN 11 8 - 23 mg/dL   Creatinine, Ser 0.64 0.44 - 1.00 mg/dL  Calcium 9.1 8.9 - 10.3 mg/dL   Total Protein 7.6 6.5 - 8.1 g/dL   Albumin 4.1 3.5 - 5.0 g/dL   AST 19 15 - 41 U/L   ALT 14 0 - 44 U/L   Alkaline Phosphatase 89 38 - 126 U/L   Total Bilirubin 1.6 (H) 0.3 - 1.2 mg/dL   GFR, Estimated >60 >60 mL/min    Comment: (NOTE) Calculated using the CKD-EPI Creatinine Equation (2021)    Anion gap 8 5 - 15    Comment: Performed at [facility], [address] [place], [place] [address]  CBC     Status: Abnormal   Collection Time: 05/22/21  3:31 PM  Result Value Ref Range   WBC 7.6 4.0 - 10.5 K/uL   RBC 5.11 3.87 - 5.11 MIL/uL   Hemoglobin 12.7 12.0 - 15.0 g/dL   HCT 39.7 36.0  - 46.0 %   MCV 77.7 (L) 80.0 - 100.0 fL   MCH 24.9 (L) 26.0 - 34.0 pg   MCHC 32.0 30.0 - 36.0 g/dL   RDW 13.9 11.5 - 15.5 %   Platelets 205 150 - 400 K/uL   nRBC 0.0 0.0 - 0.2 %    Comment: Performed at [facility], [address] [place], [place] [address]  Urinalysis, Complete w Microscopic Urine, Clean Catch     Status: Abnormal   Collection Time: 05/22/21  3:31 PM  Result Value Ref Range   Color, Urine STRAW (A) YELLOW   APPearance CLEAR (A) CLEAR   Specific Gravity, Urine 1.003 (L) 1.005 - 1.030   pH 6.0 5.0 - 8.0   Glucose, UA NEGATIVE NEGATIVE mg/dL   Hgb urine dipstick MODERATE (A) NEGATIVE   Bilirubin Urine NEGATIVE NEGATIVE   Ketones, ur NEGATIVE NEGATIVE mg/dL   Protein, ur NEGATIVE NEGATIVE mg/dL   Nitrite NEGATIVE NEGATIVE   Leukocytes,Ua NEGATIVE NEGATIVE   RBC / HPF 0-5 0 - 5 RBC/hpf   WBC, UA 0-5 0 - 5 WBC/hpf   Bacteria, UA RARE (A) NONE SEEN   Squamous Epithelial / LPF NONE SEEN 0 - 5    Comment: Performed at [facility], [address] [place], [place] [address]    CT ABDOMEN PELVIS W CONTRAST  Result Date: 05/22/2021 CLINICAL DATA:  Constipation, unspecified abdominal EXAM: CT ABDOMEN AND PELVIS WITH CONTRAST TECHNIQUE: Multidetector CT imaging of the abdomen and pelvis was performed using the standard protocol following bolus administration of intravenous contrast. CONTRAST:  51mL OMNIPAQUE IOHEXOL 300 MG/ML  SOLN COMPARISON:  None. FINDINGS: Lower chest: No acute abnormality. Hepatobiliary: Tiny hypodensity within the right hepatic dome is too small to accurately characterize, measuring 3 mm, but may represent a tiny cyst or hemangioma in a patient without a history of malignancy. No other focal liver abnormality is seen. Status post cholecystectomy. No biliary dilatation. Pancreas: Unremarkable Spleen: Unremarkable Adrenals/Urinary Tract: Adrenal glands are unremarkable. Kidneys are normal, without renal calculi, focal lesion, or  hydronephrosis. Bladder is unremarkable. Stomach/Bowel: The appendix is thickened, measuring 10 mm in diameter distally, demonstrates mild hyperemia, mild periappendiceal inflammatory stranding, and a a 3 mm appendicoliths at the base of the appendix in keeping with changes of acute, unruptured, early appendicitis. The appendix is located inferior and medial to the cecum. No evidence of obstruction. No loculated intra-abdominal fluid collections. No free intraperitoneal gas or fluid. Surgical changes of distal colectomy are identified with a a colorectal anastomotic staple line noted. The stomach, small bowel, and large bowel are otherwise  unremarkable. Vascular/Lymphatic: No significant vascular findings are present. No enlarged abdominal or pelvic lymph nodes. Reproductive: Uterus and bilateral adnexa are unremarkable. Other: Tiny fat containing umbilical and bilateral inguinal hernias, left greater than right. Rectum unremarkable. Musculoskeletal: Degenerative changes are seen within the lumbar spine. No acute bone abnormality. No lytic or blastic bone lesion identified. IMPRESSION: Unruptured appendicitis. Relatively mild inflammatory changes may reflect early acute appendicitis or a subacute to chronic inflammatory process. Appendix: Location: Inferomedial to cecum Diameter: 10 mm Appendicolith: Present, 3 mm Mucosal hyper-enhancement: Present Extraluminal gas: None Periappendiceal collection: None Electronically Signed   By: Fidela Salisbury MD   On: 05/22/2021 18:08    Review of Systems  Gastrointestinal:  Positive for abdominal distention, abdominal pain and constipation.  All other systems reviewed and are negative.  Blood pressure (!) 157/78, pulse 81, temperature 98.1 F (36.7 C), temperature source Oral, resp. rate 16, height 5\' 5"  (1.651 m), weight 65.9 kg, SpO2 100 %.Body mass index is 24.18 kg/m.  Physical Exam Constitutional:      General: She is not in acute distress.    Appearance: She  is normal weight.  HENT:     Head: Normocephalic and atraumatic.  Eyes:     General: No scleral icterus. Cardiovascular:     Rate and Rhythm: Normal rate and regular rhythm.  Pulmonary:     Effort: Pulmonary effort is normal. No respiratory distress.  Abdominal:     General: Abdomen is flat.     Palpations: Abdomen is soft.     Tenderness: There is abdominal tenderness in the right lower quadrant. There is no guarding or rebound. Positive signs include Rovsing's sign and McBurney's sign.     Comments: Low transverse incision scar from prior colectomy, as well as several laparoscopic port site scars.  Genitourinary:    Comments: Deferred Skin:    General: Skin is warm and dry.  Neurological:     General: No focal deficit present.     Mental Status: She is alert and oriented to person, place, and time.  Psychiatric:        Mood and Affect: Mood normal.        Behavior: Behavior normal.    Assessment/Plan: This is a 70 year old woman with acute appendicitis.  She would like to proceed with appendectomy.  The risks of the procedure were discussed with her.  Pending ORN anesthesia availability, we will plan a laparoscopic appendectomy.  Joyce Pace 05/22/2021, 7:38 PM

## 2021-05-23 ENCOUNTER — Encounter: Payer: Self-pay | Admitting: General Surgery

## 2021-05-23 ENCOUNTER — Telehealth: Payer: Self-pay | Admitting: General Surgery

## 2021-05-23 DIAGNOSIS — K353 Acute appendicitis with localized peritonitis, without perforation or gangrene: Secondary | ICD-10-CM | POA: Diagnosis not present

## 2021-05-23 DIAGNOSIS — K3580 Unspecified acute appendicitis: Secondary | ICD-10-CM | POA: Diagnosis not present

## 2021-05-23 LAB — COMPREHENSIVE METABOLIC PANEL
ALT: 21 U/L (ref 0–44)
AST: 30 U/L (ref 15–41)
Albumin: 3.4 g/dL — ABNORMAL LOW (ref 3.5–5.0)
Alkaline Phosphatase: 74 U/L (ref 38–126)
Anion gap: 8 (ref 5–15)
BUN: 11 mg/dL (ref 8–23)
CO2: 26 mmol/L (ref 22–32)
Calcium: 8.6 mg/dL — ABNORMAL LOW (ref 8.9–10.3)
Chloride: 102 mmol/L (ref 98–111)
Creatinine, Ser: 0.66 mg/dL (ref 0.44–1.00)
GFR, Estimated: >60 mL/min (ref >60)
Glucose, Bld: 185 mg/dL — ABNORMAL HIGH (ref 70–99)
Potassium: 4 mmol/L (ref 3.5–5.1)
Sodium: 136 mmol/L (ref 135–145)
Total Bilirubin: 1.4 mg/dL — ABNORMAL HIGH (ref 0.3–1.2)
Total Protein: 6.6 g/dL (ref 6.5–8.1)

## 2021-05-23 LAB — CBC
HCT: 36 % (ref 36.0–46.0)
Hemoglobin: 11.6 g/dL — ABNORMAL LOW (ref 12.0–15.0)
MCH: 25.3 pg — ABNORMAL LOW (ref 26.0–34.0)
MCHC: 32.2 g/dL (ref 30.0–36.0)
MCV: 78.6 fL — ABNORMAL LOW (ref 80.0–100.0)
Platelets: 171 10*3/uL (ref 150–400)
RBC: 4.58 MIL/uL (ref 3.87–5.11)
RDW: 13.6 % (ref 11.5–15.5)
WBC: 6 10*3/uL (ref 4.0–10.5)
nRBC: 0 % (ref 0.0–0.2)

## 2021-05-23 LAB — MAGNESIUM: Magnesium: 2 mg/dL (ref 1.7–2.4)

## 2021-05-23 LAB — PHOSPHORUS: Phosphorus: 3.2 mg/dL (ref 2.5–4.6)

## 2021-05-23 MED ORDER — LORAZEPAM 0.5 MG PO TABS: 0.2500 mg | ORAL_TABLET | Freq: Four times a day (QID) | ORAL | Status: DC | PRN

## 2021-05-23 MED ORDER — LACTATED RINGERS IV BOLUS
1000.0000 mL | Freq: Once | INTRAVENOUS | Status: AC
  Administered 2021-05-23: 1000 mL via INTRAVENOUS

## 2021-05-23 MED ORDER — OXYCODONE HCL 5 MG PO TABS
ORAL_TABLET | ORAL | Status: AC
  Administered 2021-05-23: 5 mg via ORAL
  Filled 2021-05-23: qty 1

## 2021-05-23 MED ORDER — BOOST / RESOURCE BREEZE PO LIQD CUSTOM
1.0000 | Freq: Three times a day (TID) | ORAL | Status: DC
  Administered 2021-05-23: 1 via ORAL

## 2021-05-23 MED ORDER — SODIUM CHLORIDE 0.9 % IV BOLUS
1000.0000 mL | Freq: Once | INTRAVENOUS | Status: AC
  Administered 2021-05-23: 1000 mL via INTRAVENOUS

## 2021-05-23 MED ORDER — FENTANYL CITRATE (PF) 100 MCG/2ML IJ SOLN
INTRAMUSCULAR | Status: AC
  Administered 2021-05-23: 25 ug via INTRAVENOUS
  Filled 2021-05-23: qty 2

## 2021-05-23 NOTE — Progress Notes (Addendum)
[facility] Day(s): 0.   Post op day(s): 1 Day Post-Op.   Interval History:  Patient seen and examined No acute events or new complaints overnight.  Patient reports she feels better overall but very tired, weak, and still with RLQ soreness No fever, chills, nausea, emesis She did endorse some dizziness this morning when standing to use the restroom She remains without leukocytosis Renal function remained normal; sCr - 0.66; UO - 275 ccs + unmeasured  No electrolyte derangements She had tolerated CLD this morning, although not taking much, and diet advancing Continues on zosyn    Vital signs in last 24 hours: [min-max] current  Temp:  [97.1 F (36.2 C)-98.9 F (37.2 C)] 97.7 F (36.5 C) (07/19 0909) Pulse Rate:  [68-84] 70 (07/19 0909) Resp:  [13-20] 16 (07/19 0909) BP: (123-193)/(57-83) 133/69 (07/19 0909) SpO2:  [97 %-100 %] 97 % (07/19 0909) Weight:  [65.9 kg] 65.9 kg (07/18 1525)     Height: 5\' 5"  (165.1 cm) Weight: 65.9 kg BMI (Calculated): 24.18   Intake/Output last 2 shifts:  07/18 0701 - 07/19 0700 In: 1255.3 [P.O.:240; I.V.:1000; IV Piggyback:15.3] Out: 278 [Urine:275; Blood:3]   Physical Exam:  Constitutional: alert, cooperative and no distress  Respiratory: breathing non-labored at rest  Cardiovascular: regular rate and sinus rhythm  Gastrointestinal: Soft, incisional soreness, non-distended, no rebound/guarding Integumentary: Laparoscopic incisions are CDI with steri-strips, no erythema or drainage   Labs:  CBC Latest Ref Rng & Units 05/23/2021 05/22/2021 07/18/2020  WBC 4.0 - 10.5 K/uL 6.0 7.6 4.8  Hemoglobin 12.0 - 15.0 g/dL 11.6(L) 12.7 12.5  Hematocrit 36.0 - 46.0 % 36.0 39.7 38.7  Platelets 150 - 400 K/uL 171 205 197   CMP Latest Ref Rng & Units 05/23/2021 05/22/2021 07/01/2020  Glucose 70 - 99 mg/dL 185(H) 103(H) 94  BUN 8 - 23 mg/dL 11 11 14   Creatinine 0.44 - 1.00 mg/dL 0.66 0.64 0.76  Sodium 135 -  145 mmol/L 136 134(L) 140  Potassium 3.5 - 5.1 mmol/L 4.0 4.1 4.6  Chloride 98 - 111 mmol/L 102 100 104  CO2 22 - 32 mmol/L 26 26 30   Calcium 8.9 - 10.3 mg/dL 8.6(L) 9.1 10.0  Total Protein 6.5 - 8.1 g/dL 6.6 7.6 7.1  Total Bilirubin 0.3 - 1.2 mg/dL 1.4(H) 1.6(H) 1.5(H)  Alkaline Phos 38 - 126 U/L 74 89 -  AST 15 - 41 U/L 30 19 17   ALT 0 - 44 U/L 21 14 12    Imaging studies: No new pertinent imaging studies   Assessment/Plan: 70 y.o. female 1 Day Post-Op s/p laparoscopic appendectomy for acute appendicitis.   - Continue regular diet as tolerated - Continue IV Abx (Zosyn); will plan on 1 week PO Augmentin for home - Monitor abdominal examination  - Pain control prn; antiemetics prn   - Mobilization as tolerated    - Discharge Planning; will plan on DC in AM pending condition    All of the above findings and recommendations were discussed with the patient, patient's family (son at bedside), and the medical team, and all of their questions were answered to their expressed satisfaction.  -- Edison Simon, PA-C [facility] 05/23/2021, 1:11 PM [phone] M-F: 7am - 4pm   I saw and evaluated the patient.  I agree with the above documentation, exam, and plan, which I have edited where appropriate. Yun Jarel Cuadra  1:50 PM

## 2021-05-23 NOTE — Discharge Summary (Signed)
[facility] SURGICAL DISCHARGE SUMMARY   Patient ID: Joyce Pace MRN: [id number] DOB/AGE: 04/21/1951 70 y.o.  Admit date: 05/22/2021 Discharge date: 05/24/2021  Discharge Diagnoses Patient Active Problem List   Diagnosis Date Noted   Acute appendicitis 05/22/2021    Consultants None  Procedures 05/22/2021:  Laparoscopic Appendectomy  HPI: She has a past medical history notable for colon cancer status post laparoscopic partial colectomy with subsequent chemotherapy in 2014, anxiety, and hypertension.  She reports that on Saturday she felt constipated and took some milk of magnesia.  This did result in a bowel movement, but she continues to feel bloated and have abdominal discomfort.  Over the past 24 hours, the pain became more pronounced in her right lower quadrant.  She presented to the emergency department for further evaluation.  She denies any fevers or chills.  No nausea or vomiting.  Work-up included labs that did not show an elevation in her white blood cell count, but a CT scan of the abdomen and pelvis was significant for early acute appendicitis.  General surgery has been consulted in this context for further evaluation and management.  Hospital Course: Informed consent was obtained and documented, and patient underwent laparoscopic appendectomy (Dr Celine Ahr, 05/22/2021).  Post-operatively, patient's pain/symptoms  improved/resolved and advancement of patient's diet and ambulation were well-tolerated. The remainder of patient's hospital course was essentially unremarkable, and discharge planning was initiated accordingly with patient safely able to be discharged home with appropriate discharge instructions, antibiotics (Augmentin x7 days), pain control, and outpatient follow-up after all of her questions were answered to her expressed satisfaction.   Discharge Condition: Good   Physical Examination:  Constitutional: Well appearing female, NAD Pulmonary:  Normal effort, no respiratory distress Gastrointestinal: Soft, incisional soreness, non-distended, no rebound/guarding Skin: Laparoscopic incisions are CDI with steri-strips, no erythema or drainage    Allergies as of 05/24/2021       Reactions   Flax Seed [bio-flax] Other (See Comments)   Throat gets itching and difficultly swallowing        Medication List     TAKE these medications    amoxicillin-clavulanate 875-125 MG tablet Commonly known as: Augmentin Take 1 tablet by mouth 2 (two) times daily for 7 days.   Colon Cleanse Caps Take 1 capsule by mouth daily as needed.   ibuprofen 600 MG tablet Commonly known as: ADVIL Take 1 tablet (600 mg total) by mouth every 6 (six) hours as needed.   lisinopril 20 MG tablet Commonly known as: ZESTRIL TAKE 1 TABLET(20 MG) BY MOUTH DAILY   LORazepam 0.5 MG tablet Commonly known as: ATIVAN Take 0.5 tablets (0.25 mg total) by mouth every 6 (six) hours as needed for anxiety.   oxyCODONE 5 MG immediate release tablet Commonly known as: Oxy IR/ROXICODONE Take 1 tablet (5 mg total) by mouth every 4 (four) hours as needed for severe pain or breakthrough pain.   PARoxetine 10 MG tablet Commonly known as: PAXIL Take 1 tablet (10 mg total) by mouth daily.   simvastatin 40 MG tablet Commonly known as: ZOCOR TAKE 1 TABLET(40 MG) BY MOUTH AT BEDTIME       ASK your doctor about these medications    aspirin EC 81 MG tablet Take 1 tablet (81 mg total) by mouth daily.   omeprazole 20 MG capsule Commonly known as: PRILOSEC TAKE 1 CAPSULE(20 MG) BY MOUTH DAILY          Follow-up Information     Tylene Fantasia, PA-C. Schedule an appointment  as soon as possible for a visit in 2 week(s).   Specialty: Physician Assistant Why: s/p laparoscopic appendectomy Contact information: [address] [place] [id number] [phone]                  Time spent on discharge management including discussion of  hospital course, clinical condition, outpatient instructions, prescriptions, and follow up with the patient and members of the medical team: >30 minutes  -- Edison Simon , PA-C [facility]  05/24/2021, 8:52 AM [phone] M-F: 7am - 4pm

## 2021-05-23 NOTE — Op Note (Signed)
Operative Note  Laparoscopic Appendectomy   Joyce Pace Date of operation:  05/23/2021  Indications: The patient presented with a history of  abdominal pain. Workup has revealed findings consistent with acute appendicitis.  Pre-operative Diagnosis: Acute appendicitis without perforation, gangrene, or abscess  Post-operative Diagnosis: Same  Surgeon: Fredirick Maudlin, MD  Anesthesia: GETA  Findings: Hyperemic and thickened appendix without any visible defect on the surrounding viscera.  No abscess, perforation, or gangrene.  Estimated Blood Loss: Less than 2 cc         Specimens: appendix         Complications: Rupture of the Endopouch extraction bag while the specimen was within the skin.  The entire appendix was able to be extracted, but did tear in the process.  Procedure Details  The patient was seen again in the preop area. The options of surgery versus observation were reviewed with the patient and/or family. The risks of bleeding, infection, recurrence of symptoms, negative laparoscopy, potential for an open procedure, bowel injury, abscess or infection, were all reviewed as well. The patient was taken to Operating Room, identified as Joyce Pace and the procedure verified as laparoscopic appendectomy. A time out was performed and the above information confirmed.  The patient was placed in the supine position and general anesthesia was induced.  Antibiotic prophylaxis was administered and VTE prophylaxis was in place. A Foley catheter was placed by the nursing staff.   The abdomen was prepped and draped in a sterile fashion.  Optiview technique was used to enter the abdomen in the left upper quadrant at Palmer's point using a 5 mm trocar.  Pneumoperitoneum obtained without any change in the patient's vital signs.  Inspection of the intra-abdominal cavity demonstrated almost no adhesive disease from her prior operations.  Two 5 mm working ports were placed under  direct visualization and the Optiview port was replaced by a 12 mm port.  The appendix was identified and found to be acutely inflamed, but did not appear to have caused any generalized peritonitis given the lack of inflammation of the surrounding viscera.  There was no evidence of perforation.  No abscess or gangrene.The appendix was carefully dissected away from the surrounding tissues.  The mesoappendix was divided with the harmonic scalpel. The base of the appendix was dissected out and divided with a standard load Endo GIA.The appendix was placed in a Endo pouch bag and removed via the 12 mm port.  Unfortunately, during the extraction, the bag ruptured, leaving the appendix within the subcutaneous tissues.  The appendix was grasped with a Babcock and wiggled free, but in the process it did tear into several pieces.  All pieces were accounted for.  The site was irrigated copiously with saline.  The PMI and Carter-Thomason cone were then used to close this site with 0 Vicryl sutures the right lower quadrant was inspected there was no sign of bleeding or bowel injury therefore pneumoperitoneum was released, all remaining ports were removed.  The skin was closed with interrupted 3-0 Vicryl and running subcuticular 4-0 Monocryl.  Dermabond and Steri-Strips were applied.  The Foley catheter was removed.  The patient was awakened, extubated, and taken to the postanesthesia care unit in good condition.     Fredirick Maudlin, MD, FACS

## 2021-05-23 NOTE — Telephone Encounter (Signed)
Error

## 2021-05-23 NOTE — Discharge Instructions (Signed)
In addition to included general post-operative instructions,  Diet: Resume home diet.   Activity: No heavy lifting >20 pounds (children, pets, laundry, garbage) or strenuous activity for 4 weeks, but light activity and walking are encouraged. Do not drive or drink alcohol if taking narcotic pain medications or having pain that might distract from driving.  Wound care: 2 days after surgery (07/20), you may shower/get incision wet with soapy water and pat dry (do not rub incisions), but no baths or submerging incision underwater until follow-up.   Medications: Resume all home medications. For mild to moderate pain: acetaminophen (Tylenol) or ibuprofen/naproxen (if no kidney disease). Combining Tylenol with alcohol can substantially increase your risk of causing liver disease. Narcotic pain medications, if prescribed, can be used for severe pain, though may cause nausea, constipation, and drowsiness. Do not combine Tylenol and Percocet (or similar) within a 6 hour period as Percocet (and similar) contain(s) Tylenol. If you do not need the narcotic pain medication, you do not need to fill the prescription.  Call office [phone] / [phone]) at any time if any questions, worsening pain, fevers/chills, bleeding, drainage from incision site, or other concerns.

## 2021-05-23 NOTE — Transfer of Care (Signed)
Immediate Anesthesia Transfer of Care Note  Patient: Joyce Pace  Procedure(s) Performed: APPENDECTOMY LAPAROSCOPIC  Patient Location: PACU  Anesthesia Type:General  Level of Consciousness: drowsy  Airway & Oxygen Therapy: Patient Spontanous Breathing and Patient connected to face mask oxygen  Post-op Assessment: Report given to RN  Post vital signs: stable  Last Vitals:  Vitals Value Taken Time  BP    Temp    Pulse 72 05/23/21 0002  Resp 20 05/23/21 0002  SpO2 100 % 05/23/21 0002  Vitals shown include unvalidated device data.  Last Pain:  Vitals:   05/22/21 2109  TempSrc:   PainSc: 0-No pain      Patients Stated Pain Goal: 2 (38/18/40 3754)  Complications: No notable events documented.

## 2021-05-24 MED ORDER — IBUPROFEN 600 MG PO TABS: 600.0000 mg | ORAL_TABLET | Freq: Four times a day (QID) | ORAL | 0 refills | Status: DC | PRN

## 2021-05-24 MED ORDER — LORAZEPAM 0.5 MG PO TABS: 0.2500 mg | ORAL_TABLET | Freq: Four times a day (QID) | ORAL | 0 refills | Status: DC | PRN

## 2021-05-24 MED ORDER — AMOXICILLIN-POT CLAVULANATE 875-125 MG PO TABS: 1.0000 | ORAL_TABLET | Freq: Two times a day (BID) | ORAL | 0 refills | Status: AC

## 2021-05-24 MED ORDER — OXYCODONE HCL 5 MG PO TABS: 5.0000 mg | ORAL_TABLET | ORAL | 0 refills | Status: DC | PRN

## 2021-05-24 NOTE — Anesthesia Postprocedure Evaluation (Signed)
Anesthesia Post Note  Patient: Barba Solt  Procedure(s) Performed: APPENDECTOMY LAPAROSCOPIC  Patient location during evaluation: PACU Anesthesia Type: General Level of consciousness: awake and alert Pain management: pain level controlled Vital Signs Assessment: post-procedure vital signs reviewed and stable Respiratory status: spontaneous breathing, nonlabored ventilation, respiratory function stable and patient connected to nasal cannula oxygen Cardiovascular status: blood pressure returned to baseline and stable Postop Assessment: no apparent nausea or vomiting Anesthetic complications: no   No notable events documented.   Last Vitals:  Vitals:   05/24/21 0455 05/24/21 0617  BP: (!) 159/66 (!) 167/76  Pulse: 64 62  Resp: 18 20  Temp: 36.7 C 36.6 C  SpO2: 96% 97%    Last Pain:  Vitals:   05/24/21 0617  TempSrc: Oral  PainSc:                  Precious Haws Shun Pletz

## 2021-05-25 ENCOUNTER — Encounter: Payer: Self-pay | Admitting: General Surgery

## 2021-05-25 LAB — SURGICAL PATHOLOGY

## 2021-05-25 MED ORDER — SODIUM CHLORIDE 0.9 % IR SOLN
Status: DC | PRN
  Administered 2021-05-22: 1000 mL

## 2021-06-06 ENCOUNTER — Other Ambulatory Visit: Payer: Self-pay

## 2021-06-06 ENCOUNTER — Encounter: Payer: Self-pay | Admitting: Physician Assistant

## 2021-06-06 ENCOUNTER — Ambulatory Visit (INDEPENDENT_AMBULATORY_CARE_PROVIDER_SITE_OTHER): Payer: Medicare Other | Admitting: Physician Assistant

## 2021-06-06 VITALS — BP 133/82 | HR 74 | Temp 98.0°F | Ht 65.0 in | Wt 151.4 lb

## 2021-06-06 DIAGNOSIS — Z09 Encounter for follow-up examination after completed treatment for conditions other than malignant neoplasm: Secondary | ICD-10-CM

## 2021-06-06 DIAGNOSIS — K353 Acute appendicitis with localized peritonitis, without perforation or gangrene: Secondary | ICD-10-CM

## 2021-06-06 NOTE — Progress Notes (Signed)
[facility] POST-OP OFFICE VISIT  06/06/2021  HPI: Joyce Pace is a 70 y.o. female 15 days s/p laparoscopic appendectomy for acute appendicitis with Dr Celine Ahr.   She is overall doing very well She reports very minimal soreness at her incision site which she has managed with ibuprofen alone No fever, chills, nausea, emesis She  is tolerating PO No other complaints  Vital signs: BP 133/82   Pulse 74   Temp 98 F (36.7 C)   Ht '5\' 5"'$  (1.651 m)   Wt 151 lb 6.4 oz (68.7 kg)   SpO2 97%   BMI 25.19 kg/m    Physical Exam: Constitutional: Well appearing female, NAD Abdomen: Soft, non-tender, non-distended, no rebound/guarding Skin: Laparoscopic incisions are well healed, no erythema or drianage   Assessment/Plan: This is a 70 y.o. female 15 days s/p laparoscopic appendectomy for acute appendicitis   - Pain control prn; OTC medications  - Reviewed wound care  - Reviewed lifting restrictions; 4 weeks total  - Reviewed surgical pathology: Acute appendicitis, negative for malignancy   - She cant rtc on as needed basis  -- Edison Simon, PA-C [facility] 06/06/2021, 10:17 AM [phone] M-F: 7am - 4pm

## 2021-06-06 NOTE — Patient Instructions (Addendum)
Follow-up with our office as needed.  Please call and ask to speak with a nurse if you develop questions or concerns.   You may use Miralax one capful in a full glass of liquid once a day will help with any constipation. You may also use fiber gummies daily.      GENERAL POST-OPERATIVE PATIENT INSTRUCTIONS   WOUND CARE INSTRUCTIONS: Try to keep the wound dry and avoid ointments on the wound unless directed to do so.  If the wound becomes bright red and painful or starts to drain infected material that is not clear, please contact your physician immediately.  If the wound is mildly pink and has a thick firm ridge underneath it, this is normal, and is referred to as a healing ridge.  This will resolve over the next 4-6 weeks.  BATHING: You may shower if you have been informed of this by your surgeon. However, Please do not submerge in a tub, hot tub, or pool until incisions are completely sealed or have been told by your surgeon that you may do so.  DIET:  You may eat any foods that you can tolerate.  It is a good idea to eat a high fiber diet and take in plenty of fluids to prevent constipation.  If you do become constipated you may want to take a mild laxative or take ducolax tablets on a daily basis until your bowel habits are regular.  Constipation can be very uncomfortable, along with straining, after recent surgery.  ACTIVITY: You may want to hug a pillow when coughing and sneezing to add additional support to the surgical area, if you had abdominal or chest surgery, which will decrease pain during these times.  You are encouraged to walk and engage in light activity for the next two weeks.  You should not lift more than 20 pounds for 4 weeks as it could put you at increased risk for complications.  Twenty pounds is roughly equivalent to a plastic bag of groceries. At that time- Listen to your body when lifting, if you have pain when lifting, stop and then try again in a few days. Soreness  after doing exercises or activities of daily living is normal as you get back in to your normal routine.  MEDICATIONS:  Try to take narcotic medications and anti-inflammatory medications, such as tylenol, ibuprofen, naprosyn, etc., with food.  This will minimize stomach upset from the medication.  Should you develop nausea and vomiting from the pain medication, or develop a rash, please discontinue the medication and contact your physician.  You should not drive, make important decisions, or operate machinery when taking narcotic pain medication.  SUNBLOCK Use sun block to incision area over the next year if this area will be exposed to sun. This helps decrease scarring and will allow you avoid a permanent darkened area over your incision.  QUESTIONS:  Please feel free to call our office if you have any questions, and we will be glad to assist you.

## 2021-07-27 ENCOUNTER — Other Ambulatory Visit: Payer: Self-pay | Admitting: Family Medicine

## 2021-07-27 DIAGNOSIS — E782 Mixed hyperlipidemia: Secondary | ICD-10-CM

## 2021-07-27 NOTE — Telephone Encounter (Signed)
Requested medications are due for refill today.  A bit soon  Requested medications are on the active medications list.  yes  Last refill. 01/27/2021 #90/1  Future visit scheduled.   no  Notes to clinic.  PCP listed as Ms. Malfi. Labs are expired. Pt last seen 07/01/2020.

## 2021-08-19 ENCOUNTER — Other Ambulatory Visit: Payer: Self-pay | Admitting: Family Medicine

## 2021-08-19 DIAGNOSIS — F32A Depression, unspecified: Secondary | ICD-10-CM

## 2021-08-19 NOTE — Telephone Encounter (Signed)
Requested Prescriptions  Pending Prescriptions Disp Refills  . PARoxetine (PAXIL) 10 MG tablet [Pharmacy Med Name: PAROXETINE 10MG  TABLETS] 31 tablet 0    Sig: TAKE 1 TABLET(10 MG) BY MOUTH DAILY     Psychiatry:  Antidepressants - SSRI Failed - 08/19/2021  8:32 AM      Failed - Completed PHQ-2 or PHQ-9 in the last 360 days      Failed - Valid encounter within last 6 months    Recent Outpatient Visits          1 year ago Annual physical exam   [facility] [place]   2 years ago Essential hypertension   [facility] Mikey College, NP   2 years ago Anxiety and depression   [facility] Merrilyn Puma, Jerrel Ivory, NP   3 years ago [facility] to [employer] preventive visit   [facility] Merrilyn Puma, Jerrel Ivory, NP   3 years ago Gastroesophageal reflux disease, esophagitis presence not specified   [facility] Merrilyn Puma, Jerrel Ivory, NP      Future Appointments            In 1 month Vigg, Avanti, MD [facility], [place]

## 2021-08-22 ENCOUNTER — Encounter: Payer: Self-pay | Admitting: General Surgery

## 2021-09-14 ENCOUNTER — Ambulatory Visit: Payer: Self-pay | Admitting: *Deleted

## 2021-09-14 NOTE — Telephone Encounter (Signed)
Reason for Disposition . Requesting regular office appointment  Answer Assessment - Initial Assessment Questions 1. REASON FOR CALL or QUESTION: "What is your reason for calling today?" or "How can I best help you?" or "What question do you have that I can help answer?"     Home health nurse did screening for patient today- advised patient to call for follow up. Patient circulation in legs: R 0.91 L 0.58. patient was told she is at risk for blood clot due to her poor circulation.  Patient states she also needs colon screening and breast mammogram  Patient reports no swelling/ no pain in legs. Patient is requesting appointment. Patient has been scheduled for physical- and note sent to office for provider review.  Protocols used: Information Only Call - No Triage-A-AH

## 2021-09-15 NOTE — Telephone Encounter (Signed)
We can cover all of her concerns at her upcoming physical

## 2021-09-19 ENCOUNTER — Ambulatory Visit: Payer: Medicare Other | Admitting: Internal Medicine

## 2021-10-16 ENCOUNTER — Encounter: Payer: Self-pay | Admitting: Internal Medicine

## 2021-10-16 ENCOUNTER — Ambulatory Visit (INDEPENDENT_AMBULATORY_CARE_PROVIDER_SITE_OTHER): Payer: 59 | Admitting: Internal Medicine

## 2021-10-16 ENCOUNTER — Other Ambulatory Visit: Payer: Self-pay

## 2021-10-16 VITALS — BP 139/68 | HR 81 | Temp 97.1°F | Resp 17 | Ht 65.0 in | Wt 151.6 lb

## 2021-10-16 DIAGNOSIS — Z780 Asymptomatic menopausal state: Secondary | ICD-10-CM | POA: Diagnosis not present

## 2021-10-16 DIAGNOSIS — E663 Overweight: Secondary | ICD-10-CM | POA: Diagnosis not present

## 2021-10-16 DIAGNOSIS — Z6825 Body mass index (BMI) 25.0-25.9, adult: Secondary | ICD-10-CM

## 2021-10-16 DIAGNOSIS — Z1231 Encounter for screening mammogram for malignant neoplasm of breast: Secondary | ICD-10-CM | POA: Diagnosis not present

## 2021-10-16 DIAGNOSIS — Z0001 Encounter for general adult medical examination with abnormal findings: Secondary | ICD-10-CM

## 2021-10-16 DIAGNOSIS — I10 Essential (primary) hypertension: Secondary | ICD-10-CM | POA: Diagnosis not present

## 2021-10-16 DIAGNOSIS — K2270 Barrett's esophagus without dysplasia: Secondary | ICD-10-CM

## 2021-10-16 DIAGNOSIS — E782 Mixed hyperlipidemia: Secondary | ICD-10-CM | POA: Diagnosis not present

## 2021-10-16 DIAGNOSIS — F419 Anxiety disorder, unspecified: Secondary | ICD-10-CM | POA: Diagnosis not present

## 2021-10-16 DIAGNOSIS — F32A Depression, unspecified: Secondary | ICD-10-CM

## 2021-10-16 NOTE — Assessment & Plan Note (Signed)
Encouraged diet and exercise for weight loss ?

## 2021-10-16 NOTE — Assessment & Plan Note (Signed)
Controlled on Lisinopril Reinforced DASH diet and exercise for weight loss CMET today

## 2021-10-16 NOTE — Assessment & Plan Note (Signed)
Try to identify and avoid foods that trigger your reflux Continue Omeprazole as needed

## 2021-10-16 NOTE — Patient Instructions (Signed)
Health Maintenance for Postmenopausal Women ?Menopause is a normal process in which your ability to get pregnant comes to an end. This process happens slowly over many months or years, usually between the ages of 48 and 55. Menopause is complete when you have missed your menstrual period for 12 months. ?It is important to talk with your health care provider about some of the most common conditions that affect women after menopause (postmenopausal women). These include heart disease, cancer, and bone loss (osteoporosis). Adopting a healthy lifestyle and getting preventive care can help to promote your health and wellness. The actions you take can also lower your chances of developing some of these common conditions. ?What are the signs and symptoms of menopause? ?During menopause, you may have the following symptoms: ?Hot flashes. These can be moderate or severe. ?Night sweats. ?Decrease in sex drive. ?Mood swings. ?Headaches. ?Tiredness (fatigue). ?Irritability. ?Memory problems. ?Problems falling asleep or staying asleep. ?Talk with your health care provider about treatment options for your symptoms. ?Do I need hormone replacement therapy? ?Hormone replacement therapy is effective in treating symptoms that are caused by menopause, such as hot flashes and night sweats. ?Hormone replacement carries certain risks, especially as you become older. If you are thinking about using estrogen or estrogen with progestin, discuss the benefits and risks with your health care provider. ?How can I reduce my risk for heart disease and stroke? ?The risk of heart disease, heart attack, and stroke increases as you age. One of the causes may be a change in the body's hormones during menopause. This can affect how your body uses dietary fats, triglycerides, and cholesterol. Heart attack and stroke are medical emergencies. There are many things that you can do to help prevent heart disease and stroke. ?Watch your blood pressure ?High  blood pressure causes heart disease and increases the risk of stroke. This is more likely to develop in people who have high blood pressure readings or are overweight. ?Have your blood pressure checked: ?Every 3-5 years if you are 18-39 years of age. ?Every year if you are 40 years old or older. ?Eat a healthy diet ? ?Eat a diet that includes plenty of vegetables, fruits, low-fat dairy products, and lean protein. ?Do not eat a lot of foods that are high in solid fats, added sugars, or sodium. ?Get regular exercise ?Get regular exercise. This is one of the most important things you can do for your health. Most adults should: ?Try to exercise for at least 150 minutes each week. The exercise should increase your heart rate and make you sweat (moderate-intensity exercise). ?Try to do strengthening exercises at least twice each week. Do these in addition to the moderate-intensity exercise. ?Spend less time sitting. Even light physical activity can be beneficial. ?Other tips ?Work with your health care provider to achieve or maintain a healthy weight. ?Do not use any products that contain nicotine or tobacco. These products include cigarettes, chewing tobacco, and vaping devices, such as e-cigarettes. If you need help quitting, ask your health care provider. ?Know your numbers. Ask your health care provider to check your cholesterol and your blood sugar (glucose). Continue to have your blood tested as directed by your health care provider. ?Do I need screening for cancer? ?Depending on your health history and family history, you may need to have cancer screenings at different stages of your life. This may include screening for: ?Breast cancer. ?Cervical cancer. ?Lung cancer. ?Colorectal cancer. ?What is my risk for osteoporosis? ?After menopause, you may be   at increased risk for osteoporosis. Osteoporosis is a condition in which bone destruction happens more quickly than new bone creation. To help prevent osteoporosis or  the bone fractures that can happen because of osteoporosis, you may take the following actions: ?If you are 19-50 years old, get at least 1,000 mg of calcium and at least 600 international units (IU) of vitamin D per day. ?If you are older than age 50 but younger than age 70, get at least 1,200 mg of calcium and at least 600 international units (IU) of vitamin D per day. ?If you are older than age 70, get at least 1,200 mg of calcium and at least 800 international units (IU) of vitamin D per day. ?Smoking and drinking excessive alcohol increase the risk of osteoporosis. Eat foods that are rich in calcium and vitamin D, and do weight-bearing exercises several times each week as directed by your health care provider. ?How does menopause affect my mental health? ?Depression may occur at any age, but it is more common as you become older. Common symptoms of depression include: ?Feeling depressed. ?Changes in sleep patterns. ?Changes in appetite or eating patterns. ?Feeling an overall lack of motivation or enjoyment of activities that you previously enjoyed. ?Frequent crying spells. ?Talk with your health care provider if you think that you are experiencing any of these symptoms. ?General instructions ?See your health care provider for regular wellness exams and vaccines. This may include: ?Scheduling regular health, dental, and eye exams. ?Getting and maintaining your vaccines. These include: ?Influenza vaccine. Get this vaccine each year before the flu season begins. ?Pneumonia vaccine. ?Shingles vaccine. ?Tetanus, diphtheria, and pertussis (Tdap) booster vaccine. ?Your health care provider may also recommend other immunizations. ?Tell your health care provider if you have ever been abused or do not feel safe at home. ?Summary ?Menopause is a normal process in which your ability to get pregnant comes to an end. ?This condition causes hot flashes, night sweats, decreased interest in sex, mood swings, headaches, or lack  of sleep. ?Treatment for this condition may include hormone replacement therapy. ?Take actions to keep yourself healthy, including exercising regularly, eating a healthy diet, watching your weight, and checking your blood pressure and blood sugar levels. ?Get screened for cancer and depression. Make sure that you are up to date with all your vaccines. ?This information is not intended to replace advice given to you by your health care provider. Make sure you discuss any questions you have with your health care provider. ?Document Revised: 03/13/2021 Document Reviewed: 03/13/2021 ?Elsevier Patient Education ? 2022 Elsevier Inc. ? ?

## 2021-10-16 NOTE — Assessment & Plan Note (Signed)
Advised her to take her Paroxetine daily No longer taking Lorazepam

## 2021-10-16 NOTE — Assessment & Plan Note (Signed)
CMET and lipid profile today Encouraged her to consume a low fat diet Continue Simvastatin

## 2021-10-16 NOTE — Progress Notes (Signed)
Subjective:    Patient ID: Joyce Pace, female    DOB: [birth year], 70 y.o.   MRN: [id number]  HPI  Pt presents to the clinic today for her annual exam. She is also due to follow up chronic conditions.  HTN: Her BP today is 148/71. She is taking Lisinopril as prescribed. ECG from 05/2021 reviewed.  HLD: Her last LDL was 127, triglycerides 117, 06/2020. She denies myalgias on Simvastastin. She tries to consume a low fat diet.  GERD  with Barrett's Esophagus: She is not sure what triggers this. She takes Omeprazole as needed. Upper GI from 06/2018 reviewed.  Anxiety and Depression: Chronic, managed on Paroxetine and Lorazepam. She is not currently seeing a therapist. She denies SI/HI.  Flu: 09/2018 Tetanus: 3-4 years ago Pneumovax: 06/2019 Prevnar: 06/2018 Shingrix: 12/2018 Covid: Pfizer x 4 Pap smear: 06/2018 Mammogram: > 2 years ago Bone density: never Colon screening: 06/2018 Vision screening: annually Dentist: biannually  Diet: She does not eat meat. She consumes more veggies than fruits. She tries to avoid fried foods. She drinks mostly water, tea. Exercise: Gym 2 x week, walking  Review of Systems  Past Medical History:  Diagnosis Date   Anemia    Anxiety    Colon cancer ([place]) 2014   Hypertension     Current Outpatient Medications  Medication Sig Dispense Refill   aspirin EC 81 MG tablet Take 1 tablet (81 mg total) by mouth daily. (Patient taking differently: Take 81 mg by mouth daily as needed.)     ibuprofen (ADVIL) 600 MG tablet Take 1 tablet (600 mg total) by mouth every 6 (six) hours as needed. 30 tablet 0   lisinopril (ZESTRIL) 20 MG tablet TAKE 1 TABLET(20 MG) BY MOUTH DAILY 90 tablet 1   LORazepam (ATIVAN) 0.5 MG tablet Take 0.5 tablets (0.25 mg total) by mouth every 6 (six) hours as needed for anxiety. 15 tablet 0   Misc Natural Products (COLON CLEANSE) CAPS Take 1 capsule by mouth daily as needed.     omeprazole (PRILOSEC) 20 MG capsule TAKE 1  CAPSULE(20 MG) BY MOUTH DAILY (Patient taking differently: Take 20 mg by mouth daily as needed.) 90 capsule 0   PARoxetine (PAXIL) 10 MG tablet TAKE 1 TABLET(10 MG) BY MOUTH DAILY 31 tablet 0   simvastatin (ZOCOR) 40 MG tablet TAKE 1 TABLET(40 MG) BY MOUTH AT BEDTIME 90 tablet 3   No current facility-administered medications for this visit.    Allergies  Allergen Reactions   Flax Seed [Bio-Flax] Other (See Comments)    Throat gets itching and difficultly swallowing    Family History  Problem Relation Age of Onset   Hyperlipidemia Mother    Hypertension Mother    Diabetes Father    Hypertension Sister    Hyperlipidemia Sister    Hyperlipidemia Brother    Hypertension Brother    Ovarian cancer Daughter    Healthy Son    Heart disease Maternal Grandmother    Heart disease Maternal Grandfather    Heart disease Paternal Grandmother    Heart disease Paternal Grandfather    Hyperlipidemia Sister    Healthy Daughter     Social History   Socioeconomic History   Marital status: Married    Spouse name: Not on file   Number of children: Not on file   Years of education: Not on file   Highest education level: High school graduate  Occupational History   Occupation: retired  Tobacco Use   Smoking status: Never  Smokeless tobacco: Never  Vaping Use   Vaping Use: Never used  Substance and Sexual Activity   Alcohol use: No   Drug use: No   Sexual activity: Not Currently  Other Topics Concern   Not on file  Social History Narrative   Not on file   Social Determinants of Health   Financial Resource Strain: Not on file  Food Insecurity: Not on file  Transportation Needs: Not on file  Physical Activity: Not on file  Stress: Not on file  Social Connections: Not on file  Intimate Partner Violence: Not on file     Constitutional: Denies fever, malaise, fatigue, headache or abrupt weight changes.  HEENT: Denies eye pain, eye redness, ear pain, ringing in the ears, wax  buildup, runny nose, nasal congestion, bloody nose, or sore throat. Respiratory: Denies difficulty breathing, shortness of breath, cough or sputum production.   Cardiovascular: Denies chest pain, chest tightness, palpitations or swelling in the hands or feet.  Gastrointestinal: Denies abdominal pain, bloating, constipation, diarrhea or blood in the stool.  GU: Denies urgency, frequency, pain with urination, burning sensation, blood in urine, odor or discharge. Musculoskeletal: Denies decrease in range of motion, difficulty with gait, muscle pain or joint pain and swelling.  Skin: Denies redness, rashes, lesions or ulcercations.  Neurological: Denies dizziness, difficulty with memory, difficulty with speech or problems with balance and coordination.  Psych: Pt has a history of anxiety and depression. Denies SI/HI.  No other specific complaints in a complete review of systems (except as listed in HPI above).     Objective:   Physical Exam  BP (!) 148/71 (BP Location: Left Arm, Patient Position: Sitting, Cuff Size: Normal)   Pulse 81   Temp (!) 97.1 F (36.2 C) (Temporal)   Resp 17   Ht 5\' 5"  (1.651 m)   Wt 151 lb 9.6 oz (68.8 kg)   SpO2 100%   BMI 25.23 kg/m   Wt Readings from Last 3 Encounters:  06/06/21 151 lb 6.4 oz (68.7 kg)  05/22/21 145 lb 4.5 oz (65.9 kg)  07/18/20 145 lb 4.8 oz (65.9 kg)    General: Appears her  stated age, overweight, in NAD. Skin: Warm, dry and intact.  HEENT: Head: normal shape and size; Eyes: sclera white and EOMs intact;  Neck:  Neck supple, trachea midline. No masses, lumps or thyromegaly present.  Cardiovascular: Normal rate and rhythm. S1,S2 noted.  No murmur, rubs or gallops noted. No JVD or BLE edema. No carotid bruits noted. Pulmonary/Chest: Normal effort and positive vesicular breath sounds. No respiratory distress. No wheezes, rales or ronchi noted.  Abdomen: Soft and nontender. Normal bowel sounds. No distention or masses noted. Liver,  spleen and kidneys non palpable. Musculoskeletal: Strength 5/5 BUE/BLE. No difficulty with gait.  Neurological: Alert and oriented. Cranial nerves II-XII grossly intact. Coordination normal.  Psychiatric: Mood and affect normal. Behavior is normal. Judgment and thought content normal.    BMET    Component Value Date/Time   NA 136 05/23/2021 0525   K 4.0 05/23/2021 0525   CL 102 05/23/2021 0525   CO2 26 05/23/2021 0525   GLUCOSE 185 (H) 05/23/2021 0525   BUN 11 05/23/2021 0525   CREATININE 0.66 05/23/2021 0525   CREATININE 0.76 07/01/2020 1056   CALCIUM 8.6 (L) 05/23/2021 0525   GFRNONAA >60 05/23/2021 0525   GFRNONAA 81 07/01/2020 1056   GFRAA 94 07/01/2020 1056    Lipid Panel     Component Value Date/Time   CHOL  209 (H) 07/01/2020 1056   TRIG 117 07/01/2020 1056   HDL 60 07/01/2020 1056   CHOLHDL 3.5 07/01/2020 1056   LDLCALC 127 (H) 07/01/2020 1056    CBC    Component Value Date/Time   WBC 6.0 05/23/2021 0525   RBC 4.58 05/23/2021 0525   HGB 11.6 (L) 05/23/2021 0525   HCT 36.0 05/23/2021 0525   PLT 171 05/23/2021 0525   MCV 78.6 (L) 05/23/2021 0525   MCH 25.3 (L) 05/23/2021 0525   MCHC 32.2 05/23/2021 0525   RDW 13.6 05/23/2021 0525   LYMPHSABS 1.5 07/18/2020 0933   MONOABS 0.4 07/18/2020 0933   EOSABS 0.3 07/18/2020 0933   BASOSABS 0.0 07/18/2020 0933    Hgb A1C No results found for: HGBA1C         Assessment & Plan:   Preventative Health Maintenance:  She declines flu shot She thinks her tetanus is UTD Pneumovax and prevnar UTD Encouraged her to get her second shingrix vaccine Covid vaccine UTD Pap smear due 2024 Mammogram and bone density ordered, she will call to schedule Colon screening UTD Encouraged her to consume a balanced diet and exercise regimen Advised her to see an eye doctor and dentist annually Will check CBC, CMET, Lipid profile today  RTC in 1 year, sooner if needed Webb Silversmith, NP This visit occurred during the  SARS-CoV-2 public health emergency.  Safety protocols were in place, including screening questions prior to the visit, additional usage of staff PPE, and extensive cleaning of exam room while observing appropriate contact time as indicated for disinfecting solutions.

## 2021-10-17 LAB — COMPLETE METABOLIC PANEL WITH GFR
AG Ratio: 1.5 (calc) (ref 1.0–2.5)
ALT: 14 U/L (ref 6–29)
AST: 19 U/L (ref 10–35)
Albumin: 4.1 g/dL (ref 3.6–5.1)
Alkaline phosphatase (APISO): 98 U/L (ref 37–153)
BUN: 12 mg/dL (ref 7–25)
CO2: 29 mmol/L (ref 20–32)
Calcium: 9.4 mg/dL (ref 8.6–10.4)
Chloride: 105 mmol/L (ref 98–110)
Creat: 0.66 mg/dL (ref 0.50–1.05)
Globulin: 2.7 g/dL (calc) (ref 1.9–3.7)
Glucose, Bld: 92 mg/dL (ref 65–139)
Potassium: 4.2 mmol/L (ref 3.5–5.3)
Sodium: 140 mmol/L (ref 135–146)
Total Bilirubin: 1.4 mg/dL — ABNORMAL HIGH (ref 0.2–1.2)
Total Protein: 6.8 g/dL (ref 6.1–8.1)
eGFR: 95 mL/min/{1.73_m2} (ref >=60)

## 2021-10-17 LAB — LIPID PANEL
Cholesterol: 146 mg/dL (ref <200)
HDL: 57 mg/dL (ref >=50)
LDL Cholesterol (Calc): 68 mg/dL (calc)
Non-HDL Cholesterol (Calc): 89 mg/dL (calc) (ref <130)
Total CHOL/HDL Ratio: 2.6 (calc) (ref <5.0)
Triglycerides: 126 mg/dL (ref <150)

## 2021-10-17 LAB — CBC
HCT: 40.4 % (ref 35.0–45.0)
Hemoglobin: 12.9 g/dL (ref 11.7–15.5)
MCH: 24.9 pg — ABNORMAL LOW (ref 27.0–33.0)
MCHC: 31.9 g/dL — ABNORMAL LOW (ref 32.0–36.0)
MCV: 77.8 fL — ABNORMAL LOW (ref 80.0–100.0)
MPV: 11.7 fL (ref 7.5–12.5)
Platelets: 235 10*3/uL (ref 140–400)
RBC: 5.19 10*6/uL — ABNORMAL HIGH (ref 3.80–5.10)
RDW: 12.8 % (ref 11.0–15.0)
WBC: 5.3 10*3/uL (ref 3.8–10.8)

## 2021-12-07 ENCOUNTER — Telehealth: Payer: Self-pay | Admitting: Internal Medicine

## 2021-12-07 NOTE — Telephone Encounter (Signed)
Copied from [place] [phone]. Topic: General - Other >> Dec 07, 2021  2:05 PM Leward Quan A wrote: Reason for CRM: Patient called in to inform Webb Silversmith that she have been waiting for a call to go get her mammogram and bone density done since her visit back on 10/16/21 she also stated that she will be needing a doppler on her leg due to information given from the nurse with [facility]. Patient asking for a call back ASAP please at Ph# [phone]

## 2021-12-08 NOTE — Telephone Encounter (Signed)
LMTCB 12/08/2021.  PEC please advise pt the orders have been placed for her mammogram and bone density.  She will need to call [place] at [phone] to schedule.  She will also need an appointment with Rollene Fare to discuss the need for a doppler.    Thanks,   -Mickel Baas

## 2021-12-11 ENCOUNTER — Telehealth: Payer: Self-pay

## 2021-12-11 NOTE — Telephone Encounter (Signed)
Pt advised of lab results.   Thanks,   -Koray Soter  

## 2021-12-11 NOTE — Telephone Encounter (Signed)
Copied from [place] [id number]. Topic: General - Inquiry >> Dec 11, 2021 10:52 AM Greggory Keen D wrote: Reason for CRM: pt called about her labs   she does not use My Chart and has not heard back on her labs from December/

## 2021-12-11 NOTE — Telephone Encounter (Signed)
Copied from [place] [id number]. Topic: Referral - Question >> Dec 11, 2021 10:48 AM Greggory Keen D wrote: Reason for CRM: Pt needs an order to get a mammogram.  She has not had one in the last 3 years.  She also needs a bone density test.  CB#  [phone]

## 2021-12-11 NOTE — Telephone Encounter (Signed)
Advised pt to call [facility] to schedule.  Thanks,   -Mickel Baas

## 2022-01-22 ENCOUNTER — Other Ambulatory Visit: Payer: Self-pay

## 2022-01-22 ENCOUNTER — Other Ambulatory Visit: Payer: Self-pay | Admitting: Internal Medicine

## 2022-01-22 ENCOUNTER — Ambulatory Visit
Admission: RE | Admit: 2022-01-22 | Discharge: 2022-01-22 | Disposition: A | Discharge status: Home / Self Care | Payer: 59 | Source: Ambulatory Visit | Attending: Internal Medicine | Admitting: Internal Medicine

## 2022-01-22 DIAGNOSIS — Z780 Asymptomatic menopausal state: Secondary | ICD-10-CM | POA: Diagnosis present

## 2022-01-22 DIAGNOSIS — Z1231 Encounter for screening mammogram for malignant neoplasm of breast: Secondary | ICD-10-CM | POA: Insufficient documentation

## 2022-01-22 DIAGNOSIS — E782 Mixed hyperlipidemia: Secondary | ICD-10-CM

## 2022-01-22 DIAGNOSIS — I10 Essential (primary) hypertension: Secondary | ICD-10-CM

## 2022-01-22 NOTE — Telephone Encounter (Signed)
Medication Refill - Medication:  ?lisinopril (ZESTRIL) 20 MG tablet  ?simvastatin (ZOCOR) 40 MG tablet ? ?Has the patient contacted their pharmacy? Yes.   ?Contact PCP ? ?Preferred Pharmacy (with phone number or street name):  ?[id number] Joyce Pace, Joyce Pace Phone:  [phone]  ?Fax:  [phone]  ?  ? ? ?Has the patient been seen for an appointment in the last year OR does the patient have an upcoming appointment? Yes.   ? ?Agent: Please be advised that RX refills may take up to 3 business days. We ask that you follow-up with your pharmacy. ?

## 2022-01-24 ENCOUNTER — Other Ambulatory Visit: Payer: Self-pay

## 2022-01-24 DIAGNOSIS — E782 Mixed hyperlipidemia: Secondary | ICD-10-CM

## 2022-01-24 MED ORDER — SIMVASTATIN 40 MG PO TABS: ORAL_TABLET | ORAL | 2 refills | Status: DC

## 2022-01-24 MED ORDER — SIMVASTATIN 40 MG PO TABS: ORAL_TABLET | ORAL | 1 refills | Status: DC

## 2022-01-24 MED ORDER — LISINOPRIL 20 MG PO TABS: ORAL_TABLET | ORAL | 2 refills | Status: DC

## 2022-01-24 NOTE — Telephone Encounter (Signed)
Refilling until next follow up due per last OV note 10/2022. ? ?Requested Prescriptions  ?Pending Prescriptions Disp Refills  ?? lisinopril (ZESTRIL) 20 MG tablet 90 tablet 2  ?  Sig: TAKE 1 TABLET(20 MG) BY MOUTH DAILY  ?  ? Cardiovascular:  ACE Inhibitors Passed - 01/22/2022  1:43 PM  ?  ?  Passed - Cr in normal range and within 180 days  ?  Creat  ?Date Value Ref Range Status  ?10/16/2021 0.66 0.50 - 1.05 mg/dL Final  ?   ?  ?  Passed - K in normal range and within 180 days  ?  Potassium  ?Date Value Ref Range Status  ?10/16/2021 4.2 3.5 - 5.3 mmol/L Final  ?   ?  ?  Passed - Patient is not pregnant  ?  ?  Passed - Last BP in normal range  ?  BP Readings from Last 1 Encounters:  ?10/16/21 139/68  ?   ?  ?  Passed - Valid encounter within last 6 months  ?  Recent Outpatient Visits   ?      ? 3 months ago Encounter for general adult medical examination with abnormal findings  ? [facility] [place], Coralie Keens, NP  ? 1 year ago Annual physical exam  ? [facility], Lupita Raider, FNP  ? 2 years ago Essential hypertension  ? [facility] Merrilyn Puma, Jerrel Ivory, NP  ? 3 years ago Anxiety and depression  ? [facility] Merrilyn Puma, Jerrel Ivory, NP  ? 3 years ago Welcome to [employer] preventive visit  ? [facility] Merrilyn Puma, Jerrel Ivory, NP  ?  ?  ? ?  ?  ?  ?? simvastatin (ZOCOR) 40 MG tablet 90 tablet 2  ?  Sig: TAKE 1 TABLET(40 MG) BY MOUTH AT BEDTIME  ?  ? Cardiovascular:  Antilipid - Statins Failed - 01/22/2022  1:43 PM  ?  ?  Failed - Lipid Panel in normal range within the last 12 months  ?  Cholesterol  ?Date Value Ref Range Status  ?10/16/2021 146 <200 mg/dL Final  ? ?LDL Cholesterol (Calc)  ?Date Value Ref Range Status  ?10/16/2021 68 mg/dL (calc) Final  ?  Comment:  ?  Reference range: <100 ?Marland Kitchen ?Desirable range <100 mg/dL for primary prevention;   ?<70 mg/dL for patients with CHD or diabetic patients  ?with > or = 2 CHD risk factors. ?. ?LDL-C  is now calculated using the Martin-Hopkins  ?calculation, which is a validated novel method providing  ?better accuracy than the Friedewald equation in the  ?estimation of LDL-C.  ?Cresenciano Genre et al. Annamaria Helling. 1607;371(06): 2061-2068  ?(http://education.[facility].com/faq/FAQ164) ?  ? ?HDL  ?Date Value Ref Range Status  ?10/16/2021 57 > OR = 50 mg/dL Final  ? ?Triglycerides  ?Date Value Ref Range Status  ?10/16/2021 126 <150 mg/dL Final  ? ?  ?  ?  Passed - Patient is not pregnant  ?  ?  Passed - Valid encounter within last 12 months  ?  Recent Outpatient Visits   ?      ? 3 months ago Encounter for general adult medical examination with abnormal findings  ? [facility] [place], Coralie Keens, NP  ? 1 year ago Annual physical exam  ? [facility], Lupita Raider, FNP  ? 2 years ago Essential hypertension  ? [facility] Merrilyn Puma, Jerrel Ivory, NP  ? 3  years ago Anxiety and depression  ? [facility] Merrilyn Puma, Jerrel Ivory, NP  ? 3 years ago Welcome to [employer] preventive visit  ? [facility] Merrilyn Puma, Jerrel Ivory, NP  ?  ?  ? ?  ?  ?  ? ?

## 2022-02-02 ENCOUNTER — Telehealth: Payer: Self-pay

## 2022-02-02 NOTE — Telephone Encounter (Signed)
Copied from [place]. Topic: General - Other ?>> Feb 02, 2022  1:17 PM Pawlus, Brayton Layman A wrote: ?Reason for CRM: Pt was calling in to go over her latest Mammogram / imaging results, please advise. ?

## 2022-02-02 NOTE — Telephone Encounter (Signed)
Tried calling;  Pt's line dropped.   PEC please advise pt that Rollene Fare has not signed off on the Mammogram results.  We will contact pt when it is reviewed.  ? ?Thanks,  ? ?-Mickel Baas  ?

## 2022-02-15 ENCOUNTER — Ambulatory Visit: Payer: 59 | Admitting: Internal Medicine

## 2022-03-09 DIAGNOSIS — H35372 Puckering of macula, left eye: Secondary | ICD-10-CM | POA: Diagnosis not present

## 2022-04-03 DIAGNOSIS — H2511 Age-related nuclear cataract, right eye: Secondary | ICD-10-CM | POA: Diagnosis not present

## 2022-04-11 ENCOUNTER — Encounter: Payer: Self-pay | Admitting: Ophthalmology

## 2022-04-16 NOTE — Discharge Instructions (Signed)

## 2022-04-17 ENCOUNTER — Ambulatory Visit
Admission: RE | Admit: 2022-04-17 | Discharge: 2022-04-17 | Disposition: A | Discharge status: Home / Self Care | Payer: Medicare Other | Source: Ambulatory Visit | Attending: Ophthalmology | Admitting: Ophthalmology

## 2022-04-17 ENCOUNTER — Ambulatory Visit: Payer: Medicare Other | Admitting: Anesthesiology

## 2022-04-17 ENCOUNTER — Encounter: Payer: Self-pay | Admitting: Ophthalmology

## 2022-04-17 ENCOUNTER — Encounter
Admission: RE | Disposition: A | Discharge status: Home / Self Care | Payer: Self-pay | Source: Ambulatory Visit | Attending: Ophthalmology

## 2022-04-17 ENCOUNTER — Other Ambulatory Visit: Payer: Self-pay

## 2022-04-17 DIAGNOSIS — F419 Anxiety disorder, unspecified: Secondary | ICD-10-CM | POA: Insufficient documentation

## 2022-04-17 DIAGNOSIS — H25812 Combined forms of age-related cataract, left eye: Secondary | ICD-10-CM | POA: Diagnosis not present

## 2022-04-17 DIAGNOSIS — Z8249 Family history of ischemic heart disease and other diseases of the circulatory system: Secondary | ICD-10-CM | POA: Insufficient documentation

## 2022-04-17 DIAGNOSIS — I10 Essential (primary) hypertension: Secondary | ICD-10-CM | POA: Insufficient documentation

## 2022-04-17 DIAGNOSIS — H2512 Age-related nuclear cataract, left eye: Secondary | ICD-10-CM | POA: Diagnosis not present

## 2022-04-17 HISTORY — DX: Motion sickness, initial encounter: T75.3XXA

## 2022-04-17 HISTORY — PX: CATARACT EXTRACTION W/PHACO: SHX586

## 2022-04-17 SURGERY — PHACOEMULSIFICATION, CATARACT, WITH IOL INSERTION
Anesthesia: Monitor Anesthesia Care | Site: Eye | Laterality: Left

## 2022-04-17 MED ORDER — ACETAMINOPHEN 325 MG PO TABS: 325.0000 mg | ORAL_TABLET | Freq: Once | ORAL | Status: DC

## 2022-04-17 MED ORDER — SIGHTPATH DOSE#1 BSS IO SOLN
INTRAOCULAR | Status: DC | PRN
  Administered 2022-04-17: 15 mL

## 2022-04-17 MED ORDER — BRIMONIDINE TARTRATE-TIMOLOL 0.2-0.5 % OP SOLN
OPHTHALMIC | Status: DC | PRN
  Administered 2022-04-17: 1 [drp] via OPHTHALMIC

## 2022-04-17 MED ORDER — TETRACAINE HCL 0.5 % OP SOLN
1.0000 [drp] | OPHTHALMIC | Status: DC | PRN
  Administered 2022-04-17 (×3): 1 [drp] via OPHTHALMIC

## 2022-04-17 MED ORDER — ACETAMINOPHEN 160 MG/5ML PO SOLN: 325.0000 mg | Freq: Once | ORAL | Status: DC

## 2022-04-17 MED ORDER — SIGHTPATH DOSE#1 BSS IO SOLN
INTRAOCULAR | Status: DC | PRN
  Administered 2022-04-17: 1 mL via INTRAMUSCULAR

## 2022-04-17 MED ORDER — SIGHTPATH DOSE#1 NA CHONDROIT SULF-NA HYALURON 40-17 MG/ML IO SOLN
INTRAOCULAR | Status: DC | PRN
  Administered 2022-04-17: 1 mL via INTRAOCULAR

## 2022-04-17 MED ORDER — SIGHTPATH DOSE#1 BSS IO SOLN
INTRAOCULAR | Status: DC | PRN
  Administered 2022-04-17: 68 mL via OPHTHALMIC

## 2022-04-17 MED ORDER — LACTATED RINGERS IV SOLN: INTRAVENOUS | Status: DC

## 2022-04-17 MED ORDER — FENTANYL CITRATE (PF) 100 MCG/2ML IJ SOLN
INTRAMUSCULAR | Status: DC | PRN
Start: 2022-04-17 — End: 2022-04-17
  Administered 2022-04-17: 50 ug via INTRAVENOUS

## 2022-04-17 MED ORDER — ARMC OPHTHALMIC DILATING DROPS
1.0000 "application " | OPHTHALMIC | Status: DC | PRN
  Administered 2022-04-17 (×3): 1 via OPHTHALMIC

## 2022-04-17 MED ORDER — MOXIFLOXACIN HCL 0.5 % OP SOLN
OPHTHALMIC | Status: DC | PRN
  Administered 2022-04-17: 0.2 mL via OPHTHALMIC

## 2022-04-17 MED ORDER — MIDAZOLAM HCL 2 MG/2ML IJ SOLN
INTRAMUSCULAR | Status: DC | PRN
  Administered 2022-04-17: 1 mg via INTRAVENOUS

## 2022-04-17 SURGICAL SUPPLY — 10 items
CATARACT SUITE SIGHTPATH (MISCELLANEOUS) ×2 IMPLANT
FEE CATARACT SUITE SIGHTPATH (MISCELLANEOUS) ×1 IMPLANT
GLOVE SURG ENC TEXT LTX SZ8 (GLOVE) ×2 IMPLANT
GLOVE SURG TRIUMPH 8.0 PF LTX (GLOVE) ×2 IMPLANT
LENS IOL TECNIS EYHANCE 25.5 (Intraocular Lens) ×1 IMPLANT
NDL FILTER BLUNT 18X1 1/2 (NEEDLE) ×1 IMPLANT
NEEDLE FILTER BLUNT 18X 1/2SAF (NEEDLE) ×1
NEEDLE FILTER BLUNT 18X1 1/2 (NEEDLE) ×1 IMPLANT
SYR 3ML LL SCALE MARK (SYRINGE) ×2 IMPLANT
WATER STERILE IRR 250ML POUR (IV SOLUTION) ×2 IMPLANT

## 2022-04-17 NOTE — Transfer of Care (Signed)
Immediate Anesthesia Transfer of Care Note  Patient: Joyce Pace  Procedure(s) Performed: CATARACT EXTRACTION PHACO AND INTRAOCULAR LENS PLACEMENT (IOC) LEFT 14.22 01:16.2 (Left: Eye)  Patient Location: PACU  Anesthesia Type: MAC  Level of Consciousness: awake, alert  and patient cooperative  Airway and Oxygen Therapy: Patient Spontanous Breathing and Patient connected to supplemental oxygen  Post-op Assessment: Post-op Vital signs reviewed, Patient's Cardiovascular Status Stable, Respiratory Function Stable, Patent Airway and No signs of Nausea or vomiting  Post-op Vital Signs: Reviewed and stable  Complications: No notable events documented.

## 2022-04-17 NOTE — H&P (Signed)
[facility]   Primary Care Physician:  Jearld Fenton, NP Ophthalmologist: Dr. George Ina  Pre-Procedure History & Physical: HPI:  Joyce Pace is a 71 y.o. female here for cataract surgery.   Past Medical History:  Diagnosis Date   Anemia    Anxiety    Colon cancer ([place]) 2014   Hypertension    Motion sickness     Past Surgical History:  Procedure Laterality Date   CHOLECYSTECTOMY     COLON SURGERY  2016   colon resection   COLONOSCOPY WITH PROPOFOL N/A 06/12/2018   Procedure: COLONOSCOPY WITH PROPOFOL;  Surgeon: Virgel Manifold, MD;  Location: [facility] ENDOSCOPY;  Service: Endoscopy;  Laterality: N/A;   ESOPHAGOGASTRODUODENOSCOPY (EGD) WITH PROPOFOL N/A 06/12/2018   Procedure: ESOPHAGOGASTRODUODENOSCOPY (EGD) WITH PROPOFOL;  Surgeon: Virgel Manifold, MD;  Location: [facility] ENDOSCOPY;  Service: Endoscopy;  Laterality: N/A;   LAPAROSCOPIC APPENDECTOMY N/A 05/22/2021   Procedure: APPENDECTOMY LAPAROSCOPIC;  Surgeon: Fredirick Maudlin, MD;  Location: [facility] ORS;  Service: General;  Laterality: N/A;    Prior to Admission medications   Medication Sig Start Date End Date Taking? Authorizing Provider  ibuprofen (ADVIL) 600 MG tablet Take 1 tablet (600 mg total) by mouth every 6 (six) hours as needed. 05/24/21  Yes Edison Simon R, PA-C  lisinopril (ZESTRIL) 20 MG tablet TAKE 1 TABLET(20 MG) BY MOUTH DAILY 01/24/22  Yes Baity, Coralie Keens, NP  LORazepam (ATIVAN) 0.5 MG tablet Take 0.5 tablets (0.25 mg total) by mouth every 6 (six) hours as needed for anxiety. 05/24/21  Yes Tylene Fantasia, PA-C  omeprazole (PRILOSEC) 20 MG capsule TAKE 1 CAPSULE(20 MG) BY MOUTH DAILY Patient taking differently: Take 20 mg by mouth daily as needed. 11/03/19  Yes Karamalegos, Devonne Doughty, DO  PARoxetine (PAXIL) 10 MG tablet TAKE 1 TABLET(10 MG) BY MOUTH DAILY Patient taking differently: Take 10 mg by mouth every other day. 08/19/21  Yes Karamalegos, Devonne Doughty, DO  simvastatin (ZOCOR) 40 MG  tablet TAKE 1 TABLET(40 MG) BY MOUTH AT BEDTIME 01/24/22  Yes Baity, Coralie Keens, NP  aspirin EC 81 MG tablet Take 1 tablet (81 mg total) by mouth daily. Patient taking differently: Take 81 mg by mouth daily as needed. 12/11/18   Mikey College, NP  Misc Natural Products (COLON CLEANSE) CAPS Take 1 capsule by mouth daily as needed.    [provider]    Allergies as of 03/12/2022 - Review Complete 10/16/2021  Allergen Reaction Noted   Flax seed [bio-flax] Other (See Comments) 07/18/2020    Family History  Problem Relation Age of Onset   Hyperlipidemia Mother    Hypertension Mother    Diabetes Father    Hypertension Sister    Hyperlipidemia Sister    Hyperlipidemia Brother    Hypertension Brother    Ovarian cancer Daughter    Healthy Son    Heart disease Maternal Grandmother    Heart disease Maternal Grandfather    Heart disease Paternal Grandmother    Heart disease Paternal Grandfather    Hyperlipidemia Sister    Healthy Daughter     Social History   Socioeconomic History   Marital status: Widowed    Spouse name: Not on file   Number of children: Not on file   Years of education: Not on file   Highest education level: High school graduate  Occupational History   Occupation: retired  Tobacco Use   Smoking status: Never   Smokeless tobacco: Never  Vaping Use   Vaping Use:  Never used  Substance and Sexual Activity   Alcohol use: No   Drug use: No   Sexual activity: Not Currently  Other Topics Concern   Not on file  Social History Narrative   Not on file   Social Determinants of Health   Financial Resource Strain: Low Risk  (02/19/2018)   Overall Financial Resource Strain (CARDIA)    Difficulty of Paying Living Expenses: Not very hard  Food Insecurity: No Food Insecurity (02/19/2018)   Hunger Vital Sign    Worried About Running Out of Food in the Last Year: Never true    Ran Out of Food in the Last Year: Never true  Transportation Needs: No  Transportation Needs (02/19/2018)   PRAPARE - Hydrologist (Medical): No    Lack of Transportation (Non-Medical): No  Physical Activity: Unknown (06/23/2019)   Exercise Vital Sign    Days of Exercise per Week: 7 days    Minutes of Exercise per Session: Not on file  Stress: Stress Concern Present (02/19/2018)   [place]    Feeling of Stress : To some extent  Social Connections: Socially Integrated (06/18/2018)   Social Connection and Isolation Panel [NHANES]    Frequency of Communication with Friends and Family: Three times a week    Frequency of Social Gatherings with Friends and Family: More than three times a week    Attends Religious Services: More than 4 times per year    Active Member of [employer] or Organizations: Yes    Attends Music therapist: More than 4 times per year    Marital Status: Married  Human resources officer Violence: Not At Risk (02/19/2018)   Humiliation, Afraid, Rape, and Kick questionnaire    Fear of Current or Ex-Partner: No    Emotionally Abused: No    Physically Abused: No    Sexually Abused: No    Review of Systems: See HPI, otherwise negative ROS  Physical Exam: BP (!) 172/82   Pulse 77   Temp (!) 97.1 F (36.2 C) (Temporal)   Ht '5\' 2"'$  (1.575 m)   Wt 66.2 kg   SpO2 100%   BMI 26.70 kg/m  General:   Alert, cooperative in NAD Head:  Normocephalic and atraumatic. Respiratory:  Normal work of breathing. Cardiovascular:  RRR  Impression/Plan: Joyce Pace is here for cataract surgery.  Risks, benefits, limitations, and alternatives regarding cataract surgery have been reviewed with the patient.  Questions have been answered.  All parties agreeable.   Birder Robson, MD  04/17/2022, 9:56 AM

## 2022-04-17 NOTE — Anesthesia Procedure Notes (Signed)
Procedure Name: MAC Date/Time: 04/17/2022 10:05 AM  Performed by: Dionne Bucy, CRNAPre-anesthesia Checklist: Patient identified, Emergency Drugs available, Suction available, Patient being monitored and Timeout performed Patient Re-evaluated:Patient Re-evaluated prior to induction Oxygen Delivery Method: Nasal cannula Placement Confirmation: positive ETCO2

## 2022-04-17 NOTE — Anesthesia Preprocedure Evaluation (Signed)
Anesthesia Evaluation  Patient identified by MRN, date of birth, ID band Patient awake    Reviewed: Allergy & Precautions, H&P , NPO status , Patient's Chart, lab work & pertinent test results  Airway Mallampati: II  TM Distance: >3 FB Neck ROM: full    Dental no notable dental hx.    Pulmonary    Pulmonary exam normal breath sounds clear to auscultation       Cardiovascular hypertension, Normal cardiovascular exam Rhythm:regular Rate:Normal     Neuro/Psych Anxiety    GI/Hepatic   Endo/Other    Renal/GU      Musculoskeletal   Abdominal   Peds  Hematology   Anesthesia Other Findings   Reproductive/Obstetrics                             Anesthesia Physical Anesthesia Plan  ASA: 2  Anesthesia Plan: MAC   Post-op Pain Management: Minimal or no pain anticipated   Induction:   PONV Risk Score and Plan: 2 and Treatment may vary due to age or medical condition, TIVA and Midazolam  Airway Management Planned:   Additional Equipment:   Intra-op Plan:   Post-operative Plan:   Informed Consent: I have reviewed the patients History and Physical, chart, labs and discussed the procedure including the risks, benefits and alternatives for the proposed anesthesia with the patient or authorized representative who has indicated his/her understanding and acceptance.     Dental Advisory Given  Plan Discussed with: CRNA  Anesthesia Plan Comments:         Anesthesia Quick Evaluation

## 2022-04-17 NOTE — Anesthesia Postprocedure Evaluation (Signed)
Anesthesia Post Note  Patient: Joyce Pace  Procedure(s) Performed: CATARACT EXTRACTION PHACO AND INTRAOCULAR LENS PLACEMENT (IOC) LEFT 14.22 01:16.2 (Left: Eye)     Patient location during evaluation: PACU Anesthesia Type: MAC Level of consciousness: awake and alert and oriented Pain management: satisfactory to patient Vital Signs Assessment: post-procedure vital signs reviewed and stable Respiratory status: spontaneous breathing, nonlabored ventilation and respiratory function stable Cardiovascular status: blood pressure returned to baseline and stable Postop Assessment: Adequate PO intake and No signs of nausea or vomiting Anesthetic complications: no   No notable events documented.  Raliegh Ip

## 2022-04-17 NOTE — Op Note (Signed)
PREOPERATIVE DIAGNOSIS:  Nuclear sclerotic cataract of the left eye.   POSTOPERATIVE DIAGNOSIS:  Nuclear sclerotic cataract of the left eye.   OPERATIVE PROCEDURE:ORPROCALL@   SURGEON:  Birder Robson, MD.   ANESTHESIA:  Anesthesiologist: Ronelle Nigh, MD CRNA: Dionne Bucy, CRNA  1.      Managed anesthesia care. 2.     0.65m of Shugarcaine was instilled following the paracentesis   COMPLICATIONS:  None.   TECHNIQUE:   Stop and chop   DESCRIPTION OF PROCEDURE:  The patient was examined and consented in the preoperative holding area where the aforementioned topical anesthesia was applied to the left eye and then brought back to the Operating Room where the left eye was prepped and draped in the usual sterile ophthalmic fashion and a lid speculum was placed. A paracentesis was created with the side port blade and the anterior chamber was filled with viscoelastic. A near clear corneal incision was performed with the steel keratome. A continuous curvilinear capsulorrhexis was performed with a cystotome followed by the capsulorrhexis forceps. Hydrodissection and hydrodelineation were carried out with BSS on a blunt cannula. The lens was removed in a stop and chop  technique and the remaining cortical material was removed with the irrigation-aspiration handpiece. The capsular bag was inflated with viscoelastic and the Technis ZCB00 lens was placed in the capsular bag without complication. The remaining viscoelastic was removed from the eye with the irrigation-aspiration handpiece. The wounds were hydrated. The anterior chamber was flushed with BSS and the eye was inflated to physiologic pressure. 0.111mVigamox was placed in the anterior chamber. The wounds were found to be water tight. The eye was dressed with Combigan. The patient was given protective glasses to wear throughout the day and a shield with which to sleep tonight. The patient was also given drops with which to begin a drop regimen  today and will follow-up with me in one day. Implant Name Type Inv. Item Serial No. Manufacturer Lot No. LRB No. Used Action  LENS IOL TECNIS EYHANCE 25.5 - [id number] Lens LENS IOL TECNIS EYHANCE 25.5 [id number]  Left 1 Implanted    Procedure(s): CATARACT EXTRACTION PHACO AND INTRAOCULAR LENS PLACEMENT (IOC) LEFT 14.22 01:16.2 (Left)  Electronically signed: WiBirder Robson/13/2023 10:30 AM

## 2022-04-18 ENCOUNTER — Encounter: Payer: Self-pay | Admitting: Ophthalmology

## 2022-04-18 ENCOUNTER — Other Ambulatory Visit: Payer: Self-pay

## 2022-05-10 NOTE — Discharge Instructions (Signed)

## 2022-05-15 ENCOUNTER — Encounter
Admission: RE | Disposition: A | Discharge status: Home / Self Care | Payer: Self-pay | Source: Ambulatory Visit | Attending: Ophthalmology

## 2022-05-15 ENCOUNTER — Encounter: Payer: Self-pay | Admitting: Ophthalmology

## 2022-05-15 ENCOUNTER — Ambulatory Visit: Payer: Medicare Other | Admitting: Anesthesiology

## 2022-05-15 ENCOUNTER — Ambulatory Visit
Admission: RE | Admit: 2022-05-15 | Discharge: 2022-05-15 | Disposition: A | Discharge status: Home / Self Care | Payer: Medicare Other | Source: Ambulatory Visit | Attending: Ophthalmology | Admitting: Ophthalmology

## 2022-05-15 ENCOUNTER — Other Ambulatory Visit: Payer: Self-pay

## 2022-05-15 DIAGNOSIS — I10 Essential (primary) hypertension: Secondary | ICD-10-CM | POA: Insufficient documentation

## 2022-05-15 DIAGNOSIS — H2511 Age-related nuclear cataract, right eye: Secondary | ICD-10-CM | POA: Insufficient documentation

## 2022-05-15 DIAGNOSIS — K219 Gastro-esophageal reflux disease without esophagitis: Secondary | ICD-10-CM | POA: Insufficient documentation

## 2022-05-15 DIAGNOSIS — F419 Anxiety disorder, unspecified: Secondary | ICD-10-CM | POA: Diagnosis not present

## 2022-05-15 DIAGNOSIS — E785 Hyperlipidemia, unspecified: Secondary | ICD-10-CM | POA: Diagnosis not present

## 2022-05-15 DIAGNOSIS — D649 Anemia, unspecified: Secondary | ICD-10-CM | POA: Diagnosis not present

## 2022-05-15 DIAGNOSIS — D759 Disease of blood and blood-forming organs, unspecified: Secondary | ICD-10-CM | POA: Diagnosis not present

## 2022-05-15 DIAGNOSIS — Z85038 Personal history of other malignant neoplasm of large intestine: Secondary | ICD-10-CM | POA: Diagnosis not present

## 2022-05-15 DIAGNOSIS — F32A Depression, unspecified: Secondary | ICD-10-CM | POA: Insufficient documentation

## 2022-05-15 DIAGNOSIS — C189 Malignant neoplasm of colon, unspecified: Secondary | ICD-10-CM | POA: Diagnosis not present

## 2022-05-15 HISTORY — PX: CATARACT EXTRACTION W/PHACO: SHX586

## 2022-05-15 SURGERY — PHACOEMULSIFICATION, CATARACT, WITH IOL INSERTION
Anesthesia: Monitor Anesthesia Care | Site: Eye | Laterality: Right

## 2022-05-15 MED ORDER — SIGHTPATH DOSE#1 BSS IO SOLN
INTRAOCULAR | Status: DC | PRN
  Administered 2022-05-15: 1 mL via INTRAMUSCULAR

## 2022-05-15 MED ORDER — TETRACAINE HCL 0.5 % OP SOLN
1.0000 [drp] | OPHTHALMIC | Status: DC | PRN
  Administered 2022-05-15 (×3): 1 [drp] via OPHTHALMIC

## 2022-05-15 MED ORDER — ACETAMINOPHEN 160 MG/5ML PO SOLN: 325.0000 mg | ORAL | Status: DC | PRN

## 2022-05-15 MED ORDER — SIGHTPATH DOSE#1 NA CHONDROIT SULF-NA HYALURON 40-17 MG/ML IO SOLN
INTRAOCULAR | Status: DC | PRN
  Administered 2022-05-15: 1 mL via INTRAOCULAR

## 2022-05-15 MED ORDER — SIGHTPATH DOSE#1 BSS IO SOLN
INTRAOCULAR | Status: DC | PRN
  Administered 2022-05-15: 54 mL via OPHTHALMIC

## 2022-05-15 MED ORDER — ARMC OPHTHALMIC DILATING DROPS
1.0000 "application " | OPHTHALMIC | Status: DC | PRN
  Administered 2022-05-15 (×3): 1 via OPHTHALMIC

## 2022-05-15 MED ORDER — BRIMONIDINE TARTRATE-TIMOLOL 0.2-0.5 % OP SOLN
OPHTHALMIC | Status: DC | PRN
  Administered 2022-05-15: 1 [drp] via OPHTHALMIC

## 2022-05-15 MED ORDER — FENTANYL CITRATE (PF) 100 MCG/2ML IJ SOLN
INTRAMUSCULAR | Status: DC | PRN
  Administered 2022-05-15: 50 ug via INTRAVENOUS

## 2022-05-15 MED ORDER — ONDANSETRON HCL 4 MG/2ML IJ SOLN: 4.0000 mg | Freq: Once | INTRAMUSCULAR | Status: DC | PRN

## 2022-05-15 MED ORDER — SIGHTPATH DOSE#1 BSS IO SOLN
INTRAOCULAR | Status: DC | PRN
  Administered 2022-05-15: 15 mL

## 2022-05-15 MED ORDER — MOXIFLOXACIN HCL 0.5 % OP SOLN
OPHTHALMIC | Status: DC | PRN
  Administered 2022-05-15: 0.2 mL via OPHTHALMIC

## 2022-05-15 MED ORDER — MIDAZOLAM HCL 2 MG/2ML IJ SOLN
INTRAMUSCULAR | Status: DC | PRN
  Administered 2022-05-15: 2 mg via INTRAVENOUS

## 2022-05-15 MED ORDER — LACTATED RINGERS IV SOLN: INTRAVENOUS | Status: DC

## 2022-05-15 MED ORDER — ACETAMINOPHEN 325 MG PO TABS
650.0000 mg | ORAL_TABLET | ORAL | Status: DC | PRN
Start: 2022-05-15 — End: 2022-05-15

## 2022-05-15 SURGICAL SUPPLY — 10 items
CATARACT SUITE SIGHTPATH (MISCELLANEOUS) ×2 IMPLANT
FEE CATARACT SUITE SIGHTPATH (MISCELLANEOUS) ×1 IMPLANT
GLOVE SURG ENC TEXT LTX SZ8 (GLOVE) ×2 IMPLANT
GLOVE SURG TRIUMPH 8.0 PF LTX (GLOVE) ×2 IMPLANT
LENS IOL TECNIS EYHANCE 25.5 (Intraocular Lens) ×1 IMPLANT
NDL FILTER BLUNT 18X1 1/2 (NEEDLE) ×1 IMPLANT
NEEDLE FILTER BLUNT 18X 1/2SAF (NEEDLE) ×1
NEEDLE FILTER BLUNT 18X1 1/2 (NEEDLE) ×1 IMPLANT
SYR 3ML LL SCALE MARK (SYRINGE) ×2 IMPLANT
WATER STERILE IRR 250ML POUR (IV SOLUTION) ×2 IMPLANT

## 2022-05-15 NOTE — Transfer of Care (Signed)
Immediate Anesthesia Transfer of Care Note  Patient: Joyce Pace  Procedure(s) Performed: CATARACT EXTRACTION PHACO AND INTRAOCULAR LENS PLACEMENT (IOC) RIGHT 12.96 01:08.2 (Right: Eye)  Patient Location: PACU  Anesthesia Type: MAC  Level of Consciousness: awake, alert  and patient cooperative  Airway and Oxygen Therapy: Patient Spontanous Breathing and Patient connected to supplemental oxygen  Post-op Assessment: Post-op Vital signs reviewed, Patient's Cardiovascular Status Stable, Respiratory Function Stable, Patent Airway and No signs of Nausea or vomiting  Post-op Vital Signs: Reviewed and stable  Complications: No notable events documented.

## 2022-05-15 NOTE — Anesthesia Preprocedure Evaluation (Signed)
Anesthesia Evaluation  Patient identified by MRN, date of birth, ID band Patient awake    Reviewed: Allergy & Precautions, NPO status , Patient's Chart, lab work & pertinent test results, reviewed documented beta blocker date and time   History of Anesthesia Complications Negative for: history of anesthetic complications  Airway Mallampati: III  TM Distance: >3 FB Neck ROM: Limited    Dental   Pulmonary    breath sounds clear to auscultation       Cardiovascular hypertension, (-) angina(-) DOE  Rhythm:Regular Rate:Normal   HLD   Neuro/Psych Anxiety Depression    GI/Hepatic GERD  , Barrett's esophagus   Endo/Other    Renal/GU      Musculoskeletal   Abdominal   Peds  Hematology  (+) Blood dyscrasia, anemia ,   Anesthesia Other Findings Colon cancer  Reproductive/Obstetrics                             Anesthesia Physical Anesthesia Plan  ASA: 2  Anesthesia Plan: MAC   Post-op Pain Management:    Induction: Intravenous  PONV Risk Score and Plan: 2 and TIVA, Midazolam and Treatment may vary due to age or medical condition  Airway Management Planned: Nasal Cannula  Additional Equipment:   Intra-op Plan:   Post-operative Plan:   Informed Consent: I have reviewed the patients History and Physical, chart, labs and discussed the procedure including the risks, benefits and alternatives for the proposed anesthesia with the patient or authorized representative who has indicated his/her understanding and acceptance.       Plan Discussed with: CRNA and Anesthesiologist  Anesthesia Plan Comments:         Anesthesia Quick Evaluation

## 2022-05-15 NOTE — H&P (Signed)
[facility]   Primary Care Physician:  Jearld Fenton, NP Ophthalmologist: Dr. George Ina  Pre-Procedure History & Physical: HPI:  Joyce Pace is a 71 y.o. female here for cataract surgery.   Past Medical History:  Diagnosis Date   Anemia    Anxiety    Colon cancer ([place]) 2014   Hypertension    Motion sickness     Past Surgical History:  Procedure Laterality Date   CATARACT EXTRACTION W/PHACO Left 04/17/2022   Procedure: CATARACT EXTRACTION PHACO AND INTRAOCULAR LENS PLACEMENT (IOC) LEFT 14.22 01:16.2;  Surgeon: Birder Robson, MD;  Location: [facility];  Service: Ophthalmology;  Laterality: Left;   CHOLECYSTECTOMY     COLON SURGERY  2016   colon resection   COLONOSCOPY WITH PROPOFOL N/A 06/12/2018   Procedure: COLONOSCOPY WITH PROPOFOL;  Surgeon: Virgel Manifold, MD;  Location: [facility];  Service: Endoscopy;  Laterality: N/A;   ESOPHAGOGASTRODUODENOSCOPY (EGD) WITH PROPOFOL N/A 06/12/2018   Procedure: ESOPHAGOGASTRODUODENOSCOPY (EGD) WITH PROPOFOL;  Surgeon: Virgel Manifold, MD;  Location: [facility];  Service: Endoscopy;  Laterality: N/A;   LAPAROSCOPIC APPENDECTOMY N/A 05/22/2021   Procedure: APPENDECTOMY LAPAROSCOPIC;  Surgeon: Fredirick Maudlin, MD;  Location: [facility] OR[facility];  Service: General;  Laterality: N/A;    Prior to Admission medications   Medication Sig Start Date End Date Taking? Authorizing Provider  ibuprofen (ADVIL) 600 MG tablet Take 1 tablet (600 mg total) by mouth every 6 (six) hours as needed. 05/24/21  Yes Edison Simon R, PA-C  lisinopril (ZESTRIL) 20 MG tablet TAKE 1 TABLET(20 MG) BY MOUTH DAILY 01/24/22  Yes Baity, Coralie Keens, NP  LORazepam (ATIVAN) 0.5 MG tablet Take 0.5 tablets (0.25 mg total) by mouth every 6 (six) hours as needed for anxiety. 05/24/21  Yes Tylene Fantasia, PA-C  Misc Natural Products (COLON CLEANSE) CAPS Take 1 capsule by mouth daily as needed.   Yes [provider]  omeprazole (PRILOSEC) 20  MG capsule TAKE 1 CAPSULE(20 MG) BY MOUTH DAILY Patient taking differently: Take 20 mg by mouth daily as needed. 11/03/19  Yes Karamalegos, Devonne Doughty, DO  PARoxetine (PAXIL) 10 MG tablet TAKE 1 TABLET(10 MG) BY MOUTH DAILY Patient taking differently: Take 10 mg by mouth every other day. 08/19/21  Yes Karamalegos, Devonne Doughty, DO  simvastatin (ZOCOR) 40 MG tablet TAKE 1 TABLET(40 MG) BY MOUTH AT BEDTIME 01/24/22  Yes Baity, Coralie Keens, NP  aspirin EC 81 MG tablet Take 1 tablet (81 mg total) by mouth daily. Patient taking differently: Take 81 mg by mouth daily as needed. 12/11/18   Mikey College, NP    Allergies as of 03/12/2022 - Review Complete 10/16/2021  Allergen Reaction Noted   Flax seed [flax seed oil] Other (See Comments) 07/18/2020    Family History  Problem Relation Age of Onset   Hyperlipidemia Mother    Hypertension Mother    Diabetes Father    Hypertension Sister    Hyperlipidemia Sister    Hyperlipidemia Brother    Hypertension Brother    Ovarian cancer Daughter    Healthy Son    Heart disease Maternal Grandmother    Heart disease Maternal Grandfather    Heart disease Paternal Grandmother    Heart disease Paternal Grandfather    Hyperlipidemia Sister    Healthy Daughter     Social History   Socioeconomic History   Marital status: Widowed    Spouse name: Not on file   Number of children: Not on file   Years of  education: Not on file   Highest education level: High school graduate  Occupational History   Occupation: retired  Tobacco Use   Smoking status: Never   Smokeless tobacco: Never  Vaping Use   Vaping Use: Never used  Substance and Sexual Activity   Alcohol use: No   Drug use: No   Sexual activity: Not Currently  Other Topics Concern   Not on file  Social History Narrative   Not on file   Social Determinants of Health   Financial Resource Strain: Low Risk  (02/19/2018)   Overall Financial Resource Strain (CARDIA)    Difficulty of Paying  Living Expenses: Not very hard  Food Insecurity: No Food Insecurity (02/19/2018)   Hunger Vital Sign    Worried About Running Out of Food in the Last Year: Never true    Ran Out of Food in the Last Year: Never true  Transportation Needs: No Transportation Needs (02/19/2018)   PRAPARE - Hydrologist (Medical): No    Lack of Transportation (Non-Medical): No  Physical Activity: Unknown (06/23/2019)   Exercise Vital Sign    Days of Exercise per Week: 7 days    Minutes of Exercise per Session: Not on file  Stress: Stress Concern Present (02/19/2018)   [place]    Feeling of Stress : To some extent  Social Connections: Socially Integrated (06/18/2018)   Social Connection and Isolation Panel [NHANES]    Frequency of Communication with Friends and Family: Three times a week    Frequency of Social Gatherings with Friends and Family: More than three times a week    Attends Religious Services: More than 4 times per year    Active Member of [employer] or Organizations: Yes    Attends Music therapist: More than 4 times per year    Marital Status: Married  Human resources officer Violence: Not At Risk (02/19/2018)   Humiliation, Afraid, Rape, and Kick questionnaire    Fear of Current or Ex-Partner: No    Emotionally Abused: No    Physically Abused: No    Sexually Abused: No    Review of Systems: See HPI, otherwise negative ROS  Physical Exam: BP (!) 181/71   Pulse 71   Temp 98.6 F (37 C) (Temporal)   Resp 18   Ht '5\' 2"'$  (1.575 m)   Wt 66.7 kg   SpO2 99%   BMI 26.89 kg/m  General:   Alert, cooperative in NAD Head:  Normocephalic and atraumatic. Respiratory:  Normal work of breathing. Cardiovascular:  RRR  Impression/Plan: Joyce Pace is here for cataract surgery.  Risks, benefits, limitations, and alternatives regarding cataract surgery have been reviewed with the patient.   Questions have been answered.  All parties agreeable.   Birder Robson, MD  05/15/2022, 10:23 AM

## 2022-05-15 NOTE — Op Note (Signed)
PREOPERATIVE DIAGNOSIS:  Nuclear sclerotic cataract of the right eye.   POSTOPERATIVE DIAGNOSIS:  H25.11- CATARACT   OPERATIVE PROCEDURE:ORPROCALL@   SURGEON:  Birder Robson, MD.   ANESTHESIA:  Anesthesiologist: Heniser, Fredric Dine, MD CRNA: Silvana Newness, CRNA  1.      Managed anesthesia care. 2.      0.31m of Shugarcaine was instilled in the eye following the paracentesis.   COMPLICATIONS:  None.   TECHNIQUE:   Stop and chop   DESCRIPTION OF PROCEDURE:  The patient was examined and consented in the preoperative holding area where the aforementioned topical anesthesia was applied to the right eye and then brought back to the Operating Room where the right eye was prepped and draped in the usual sterile ophthalmic fashion and a lid speculum was placed. A paracentesis was created with the side port blade and the anterior chamber was filled with viscoelastic. A near clear corneal incision was performed with the steel keratome. A continuous curvilinear capsulorrhexis was performed with a cystotome followed by the capsulorrhexis forceps. Hydrodissection and hydrodelineation were carried out with BSS on a blunt cannula. The lens was removed in a stop and chop  technique and the remaining cortical material was removed with the irrigation-aspiration handpiece. The capsular bag was inflated with viscoelastic and the Technis ZCB00  lens was placed in the capsular bag without complication. The remaining viscoelastic was removed from the eye with the irrigation-aspiration handpiece. The wounds were hydrated. The anterior chamber was flushed with BSS and the eye was inflated to physiologic pressure. 0.134mof Vigamox was placed in the anterior chamber. The wounds were found to be water tight. The eye was dressed with Combigan. The patient was given protective glasses to wear throughout the day and a shield with which to sleep tonight. The patient was also given drops with which to begin a drop regimen today  and will follow-up with me in one day. Implant Name Type Inv. Item Serial No. Manufacturer Lot No. LRB No. Used Action  LENS IOL TECNIS EYHANCE 25.5 - [id number] Lens LENS IOL TECNIS EYHANCE 25.5 [id number]  Right 1 Implanted   Procedure(s): CATARACT EXTRACTION PHACO AND INTRAOCULAR LENS PLACEMENT (IOC) RIGHT 12.96 01:08.2 (Right)  Electronically signed: WiBirder Robson/09/2022 10:51 AM

## 2022-05-15 NOTE — Anesthesia Postprocedure Evaluation (Signed)
Anesthesia Post Note  Patient: Joyce Pace  Procedure(s) Performed: CATARACT EXTRACTION PHACO AND INTRAOCULAR LENS PLACEMENT (IOC) RIGHT 12.96 01:08.2 (Right: Eye)     Patient location during evaluation: PACU Anesthesia Type: MAC Level of consciousness: awake and alert Pain management: pain level controlled Vital Signs Assessment: post-procedure vital signs reviewed and stable Respiratory status: spontaneous breathing, nonlabored ventilation, respiratory function stable and patient connected to nasal cannula oxygen Cardiovascular status: stable and blood pressure returned to baseline Postop Assessment: no apparent nausea or vomiting Anesthetic complications: no   No notable events documented.  [name]  [name]

## 2022-05-16 ENCOUNTER — Encounter: Payer: Self-pay | Admitting: Ophthalmology

## 2022-05-16 ENCOUNTER — Ambulatory Visit: Payer: 59 | Admitting: Internal Medicine

## 2022-06-14 ENCOUNTER — Telehealth: Payer: Self-pay | Admitting: Internal Medicine

## 2022-06-14 NOTE — Telephone Encounter (Signed)
N/A unable to leave a message for patient to call back and schedule the Medicare Annual Wellness Visit (AWV) virtually or by telephone.  Last AWV 06/23/19  Please schedule at anytime with [facility].    Any questions, please call me at [phone]

## 2022-07-12 ENCOUNTER — Ambulatory Visit: Payer: 59 | Admitting: Internal Medicine

## 2022-07-13 ENCOUNTER — Ambulatory Visit (INDEPENDENT_AMBULATORY_CARE_PROVIDER_SITE_OTHER): Payer: Medicare Other | Admitting: Nurse Practitioner

## 2022-07-13 ENCOUNTER — Other Ambulatory Visit: Payer: Self-pay

## 2022-07-13 ENCOUNTER — Encounter: Payer: Self-pay | Admitting: Nurse Practitioner

## 2022-07-13 VITALS — BP 130/82 | HR 77 | Temp 97.5°F | Resp 16 | Ht 63.0 in | Wt 144.4 lb

## 2022-07-13 DIAGNOSIS — F419 Anxiety disorder, unspecified: Secondary | ICD-10-CM

## 2022-07-13 DIAGNOSIS — E782 Mixed hyperlipidemia: Secondary | ICD-10-CM

## 2022-07-13 DIAGNOSIS — K219 Gastro-esophageal reflux disease without esophagitis: Secondary | ICD-10-CM | POA: Diagnosis not present

## 2022-07-13 DIAGNOSIS — Z85038 Personal history of other malignant neoplasm of large intestine: Secondary | ICD-10-CM | POA: Diagnosis not present

## 2022-07-13 DIAGNOSIS — Z7689 Persons encountering health services in other specified circumstances: Secondary | ICD-10-CM | POA: Diagnosis not present

## 2022-07-13 DIAGNOSIS — K2270 Barrett's esophagus without dysplasia: Secondary | ICD-10-CM

## 2022-07-13 DIAGNOSIS — I10 Essential (primary) hypertension: Secondary | ICD-10-CM

## 2022-07-13 DIAGNOSIS — F330 Major depressive disorder, recurrent, mild: Secondary | ICD-10-CM

## 2022-07-13 DIAGNOSIS — F3341 Major depressive disorder, recurrent, in partial remission: Secondary | ICD-10-CM | POA: Insufficient documentation

## 2022-07-13 MED ORDER — PAROXETINE HCL 10 MG PO TABS: 10.0000 mg | ORAL_TABLET | Freq: Every day | ORAL | 0 refills | Status: DC

## 2022-07-13 MED ORDER — SIMVASTATIN 40 MG PO TABS: ORAL_TABLET | ORAL | 1 refills | Status: DC

## 2022-07-13 MED ORDER — BUSPIRONE HCL 5 MG PO TABS: 5.0000 mg | ORAL_TABLET | Freq: Three times a day (TID) | ORAL | 0 refills | Status: AC

## 2022-07-13 MED ORDER — LISINOPRIL 20 MG PO TABS: ORAL_TABLET | ORAL | 3 refills | Status: DC

## 2022-07-13 MED ORDER — OMEPRAZOLE 20 MG PO CPDR: DELAYED_RELEASE_CAPSULE | ORAL | 3 refills | Status: AC

## 2022-07-13 NOTE — Assessment & Plan Note (Signed)
Pressure today 130/82.  Continue taking lisinopril 20 mg daily.  Refill sent.

## 2022-07-13 NOTE — Assessment & Plan Note (Signed)
Patient is due for repeat colonoscopy next year.  She denies any blood in her stool or changes in her bowel habits.

## 2022-07-13 NOTE — Progress Notes (Signed)
BP 130/82   Pulse 77   Temp (!) 97.5 F (36.4 C) (Oral)   Resp 16   Ht '5\' 3"'  (1.6 m)   Wt 144 lb 6.4 oz (65.5 kg)   SpO2 99%   BMI 25.58 kg/m    Subjective:    Patient ID: Joyce Pace, female    DOB: [birth year], 71 y.o.   MRN: [id number]  HPI: Joyce Pace is a 71 y.o. female  Chief Complaint  Patient presents with   Establish Care   Establish care: Her last physical was 10/16/2021.  She has a history of HTN, hyperlipidemia, barrett's esophagus, GERD, anxiety and depression.   HTN: her blood pressure today is 130/82. She is currently taking lisinopril 20 mg daily.  She denies any chest pain, shortness of breath, headaches or blurred vision.    Hyperlipidemia:  Her last LDL was 68 on 10/16/2021.  She is currently taking simvastatin 40 mg daily.  She denies any myalgia. The 10-year ASCVD risk score (Arnett DK, et al., 2019) is: 10.4%   Values used to calculate the score:     Age: 72 years     Sex: Female     Is Non-Hispanic African American: No     Diabetic: No     Tobacco smoker: No     Systolic Blood Pressure: 600 mmHg     Is BP treated: Yes     HDL Cholesterol: 57 mg/dL     Total Cholesterol: 146 mg/dL   Barrett's esophagus/GERD: She was taking omeprazole 20 mg daily.  She says she has not been taking it lately and has noticed that she is more gassy. She says that she burps a lot. She also reports she gets gas pains in her stomach. She says she has been taking gas x which helps. Recommend restarting omeprazole.  Will send in new prescription.    Anxiety/depression: She currently taking paxil 10 mg as needed. She says that her anxiety and depression have been getting worse.  She says recently she lost her husband and mother and it has been difficult. Discussed she soul take paxil daily and not as needed.  She states she was also taking Ativan but she does not have anymore.  Discussed with patient that we would not be refilling that medication. Will start  patient on buspar to help with anxiety.      07/13/2022    9:36 AM 10/16/2021    1:51 PM 07/01/2020   10:55 AM 07/01/2019   10:20 AM 06/23/2019   10:10 AM  Depression screen PHQ 2/9  Decreased Interest 0 1 0 1 0  Down, Depressed, Hopeless 0 0 '1 1 1  ' PHQ - 2 Score 0 '1 1 2 1  ' Altered sleeping 1 0 1 0   Tired, decreased energy 0 1 0 1   Change in appetite 0 0 0 0   Feeling bad or failure about yourself  0 0 1 0   Trouble concentrating 0 0 0 0   Moving slowly or fidgety/restless 0 0 0 0   Suicidal thoughts 0 0 0 0   PHQ-9 Score '1 2 3 3   ' Difficult doing work/chores Not difficult at all  Not difficult at all Not difficult at all        07/13/2022    9:36 AM 07/01/2020   12:26 PM 07/01/2019   10:21 AM 12/01/2018   10:27 AM  GAD 7 : Generalized Anxiety Score  Nervous, Anxious, on Edge 1 0  1 1  Control/stop worrying 1 0 1 0  Worry too much - different things 1 0 1 2  Trouble relaxing 1 0 0 3  Restless 0 0 0 3  Easily annoyed or irritable 0 0 0 0  Afraid - awful might happen 0 0 0 0  Total GAD 7 Score 4 0 3 9  Anxiety Difficulty Somewhat difficult Not difficult at all Not difficult at all Somewhat difficult    History of colon cancer: Patient was seen Dr. Janese Banks oncology for hsitory of colon cancer.  She was last seen on 01/24/2021.  Colon cancer was diagnosed in 2014 while she was in [place].  Stage IIa PT 3 PN 0 grade 2.  Final pathology showed moderately differentiated adenocarcinoma extensively ulcerated with extension into muscularis propria and pericolonic adipose tissue.  Maximum size of the tumor was 5 cm with significant luminal obstruction.  Margins were -15 lymph nodes were negative for malignancy.  Patient underwent 12 cycles of adjuvant FOLFOX in September 2014.  She also subsequently had surveillance colonoscopy done and most recently was seen by Dr. Bonna Gains and underwent EGD and colonoscopy which was unremarkable.  Colonoscopy in August 2019 showed a 5 mm sigmoid polyp which was  negative for malignancy.  Last CT scan was in September 2017 which showed no evidence of malignancy.  She denies any changes in bowel habits or blood in her stool.  She will be due for repeat colonoscopy next year.  Health maintenance: Due for labs Up to date colonoscopy Up to date mammogram Up to date dexa scan  Relevant past medical, surgical, family and social history reviewed and updated as indicated. Interim medical history since our last visit reviewed. Allergies and medications reviewed and updated.  Review of Systems  Constitutional: Negative for fever or weight change.  Respiratory: Negative for cough and shortness of breath.   Cardiovascular: Negative for chest pain or palpitations.  Gastrointestinal: Negative for abdominal pain, no bowel changes.  Musculoskeletal: Negative for gait problem or joint swelling.  Skin: Negative for rash.  Neurological: Negative for dizziness or headache.  No other specific complaints in a complete review of systems (except as listed in HPI above).      Objective:    BP 130/82   Pulse 77   Temp (!) 97.5 F (36.4 C) (Oral)   Resp 16   Ht '5\' 3"'  (1.6 m)   Wt 144 lb 6.4 oz (65.5 kg)   SpO2 99%   BMI 25.58 kg/m   Wt Readings from Last 3 Encounters:  07/13/22 144 lb 6.4 oz (65.5 kg)  05/15/22 147 lb (66.7 kg)  04/17/22 146 lb (66.2 kg)    Physical Exam  Constitutional: Patient appears well-developed and well-nourished. Overweight No distress.  HEENT: head atraumatic, normocephalic, pupils equal and reactive to light, neck supple Cardiovascular: Normal rate, regular rhythm and normal heart sounds.  No murmur heard. No BLE edema. Pulmonary/Chest: Effort normal and breath sounds normal. No respiratory distress. Abdominal: Soft.  There is no tenderness. Psychiatric: Patient has a normal mood and affect. behavior is normal. Judgment and thought content normal.  Results for orders placed or performed in visit on 10/16/21  CBC  Result Value  Ref Range   WBC 5.3 3.8 - 10.8 Thousand/uL   RBC 5.19 (H) 3.80 - 5.10 Million/uL   Hemoglobin 12.9 11.7 - 15.5 g/dL   HCT 40.4 35.0 - 45.0 %   MCV 77.8 (L) 80.0 - 100.0 fL   MCH 24.9 (L)  27.0 - 33.0 pg   MCHC 31.9 (L) 32.0 - 36.0 g/dL   RDW 12.8 11.0 - 15.0 %   Platelets 235 140 - 400 Thousand/uL   MPV 11.7 7.5 - 12.5 fL  COMPLETE METABOLIC PANEL WITH GFR  Result Value Ref Range   Glucose, Bld 92 65 - 139 mg/dL   BUN 12 7 - 25 mg/dL   Creat 0.66 0.50 - 1.05 mg/dL   eGFR 95 > OR = 60 mL/min/1.58m   BUN/Creatinine Ratio NOT APPLICABLE 6 - 22 (calc)   Sodium 140 135 - 146 mmol/L   Potassium 4.2 3.5 - 5.3 mmol/L   Chloride 105 98 - 110 mmol/L   CO2 29 20 - 32 mmol/L   Calcium 9.4 8.6 - 10.4 mg/dL   Total Protein 6.8 6.1 - 8.1 g/dL   Albumin 4.1 3.6 - 5.1 g/dL   Globulin 2.7 1.9 - 3.7 g/dL (calc)   AG Ratio 1.5 1.0 - 2.5 (calc)   Total Bilirubin 1.4 (H) 0.2 - 1.2 mg/dL   Alkaline phosphatase (APISO) 98 37 - 153 U/L   AST 19 10 - 35 U/L   ALT 14 6 - 29 U/L  Lipid panel  Result Value Ref Range   Cholesterol 146 <200 mg/dL   HDL 57 > OR = 50 mg/dL   Triglycerides 126 <150 mg/dL   LDL Cholesterol (Calc) 68 mg/dL (calc)   Total CHOL/HDL Ratio 2.6 <5.0 (calc)   Non-HDL Cholesterol (Calc) 89 <130 mg/dL (calc)      Assessment & Plan:   Problem List Items Addressed This Visit       Cardiovascular and Mediastinum   Essential hypertension - Primary    Pressure today 130/82.  Continue taking lisinopril 20 mg daily.  Refill sent.      Relevant Medications   lisinopril (ZESTRIL) 20 MG tablet   simvastatin (ZOCOR) 40 MG tablet   Other Relevant Orders   CBC with Differential/Platelet   COMPLETE METABOLIC PANEL WITH GFR     Digestive   Barrett's esophagus without dysplasia    Discussed patient restarting omeprazole 20 mg daily.  Refill sent      Relevant Medications   omeprazole (PRILOSEC) 20 MG capsule   Gastroesophageal reflux disease without esophagitis    Discussed  patient restarting omeprazole 20 mg daily.  Refill sent      Relevant Medications   omeprazole (PRILOSEC) 20 MG capsule     Other   Mixed hyperlipidemia    Labs today.  Continue taking simvastatin 40 mg daily.  His LDL was within goal.      Relevant Medications   lisinopril (ZESTRIL) 20 MG tablet   simvastatin (ZOCOR) 40 MG tablet   Other Relevant Orders   Lipid panel   COMPLETE METABOLIC PANEL WITH GFR   History of colon cancer    Patient is due for repeat colonoscopy next year.  She denies any blood in her stool or changes in her bowel habits.      Anxiety    She reports increased anxiety and depression since the death of her husband and mother.  Patient was taking Paxil 10 mg as needed.  Discussed she should be taking this every day.  We will also send in prescription for BuSpar 5 mg 3 times a day as needed.      Relevant Medications   busPIRone (BUSPAR) 5 MG tablet   PARoxetine (PAXIL) 10 MG tablet   [facility] major [facility]  She reports increased anxiety and depression since the death of her husband and mother.  Patient was taking Paxil 10 mg as needed.  Discussed she should be taking this every day.  We will also send in prescription for BuSpar 5 mg 3 times a day as needed.      Relevant Medications   busPIRone (BUSPAR) 5 MG tablet   PARoxetine (PAXIL) 10 MG tablet   Other Visit Diagnoses     Encounter to establish care       she will bring in her immunization record        Follow up plan: Return in about 4 weeks (around 08/10/2022) for follow up.

## 2022-07-13 NOTE — Assessment & Plan Note (Signed)
Labs today.  Continue taking simvastatin 40 mg daily.  His LDL was within goal.

## 2022-07-13 NOTE — Assessment & Plan Note (Signed)
She reports increased anxiety and depression since the death of her husband and mother.  Patient was taking Paxil 10 mg as needed.  Discussed she should be taking this every day.  We will also send in prescription for BuSpar 5 mg 3 times a day as needed.

## 2022-07-13 NOTE — Assessment & Plan Note (Signed)
Discussed patient restarting omeprazole 20 mg daily.  Refill sent

## 2022-07-14 LAB — CBC WITH DIFFERENTIAL/PLATELET
Absolute Monocytes: 405 cells/uL (ref 200–950)
Basophils Absolute: 31 cells/uL (ref 0–200)
Basophils Relative: 0.7 %
Eosinophils Absolute: 180 cells/uL (ref 15–500)
Eosinophils Relative: 4.1 %
HCT: 39.7 % (ref 35.0–45.0)
Hemoglobin: 12.8 g/dL (ref 11.7–15.5)
Lymphs Abs: 1267 cells/uL (ref 850–3900)
MCH: 25.3 pg — ABNORMAL LOW (ref 27.0–33.0)
MCHC: 32.2 g/dL (ref 32.0–36.0)
MCV: 78.6 fL — ABNORMAL LOW (ref 80.0–100.0)
MPV: 11.4 fL (ref 7.5–12.5)
Monocytes Relative: 9.2 %
Neutro Abs: 2517 cells/uL (ref 1500–7800)
Neutrophils Relative %: 57.2 %
Platelets: 206 10*3/uL (ref 140–400)
RBC: 5.05 10*6/uL (ref 3.80–5.10)
RDW: 13.1 % (ref 11.0–15.0)
Total Lymphocyte: 28.8 %
WBC: 4.4 10*3/uL (ref 3.8–10.8)

## 2022-07-14 LAB — COMPLETE METABOLIC PANEL WITH GFR
AG Ratio: 1.5 (calc) (ref 1.0–2.5)
ALT: 13 U/L (ref 6–29)
AST: 15 U/L (ref 10–35)
Albumin: 4 g/dL (ref 3.6–5.1)
Alkaline phosphatase (APISO): 85 U/L (ref 37–153)
BUN: 16 mg/dL (ref 7–25)
CO2: 28 mmol/L (ref 20–32)
Calcium: 9.3 mg/dL (ref 8.6–10.4)
Chloride: 105 mmol/L (ref 98–110)
Creat: 0.86 mg/dL (ref 0.50–1.05)
Globulin: 2.7 g/dL (calc) (ref 1.9–3.7)
Glucose, Bld: 78 mg/dL (ref 65–99)
Potassium: 4.8 mmol/L (ref 3.5–5.3)
Sodium: 140 mmol/L (ref 135–146)
Total Bilirubin: 1.3 mg/dL — ABNORMAL HIGH (ref 0.2–1.2)
Total Protein: 6.7 g/dL (ref 6.1–8.1)
eGFR: 73 mL/min/{1.73_m2} (ref >=60)

## 2022-07-14 LAB — LIPID PANEL
Cholesterol: 149 mg/dL (ref <200)
HDL: 51 mg/dL (ref >=50)
LDL Cholesterol (Calc): 80 mg/dL (calc)
Non-HDL Cholesterol (Calc): 98 mg/dL (calc) (ref <130)
Total CHOL/HDL Ratio: 2.9 (calc) (ref <5.0)
Triglycerides: 101 mg/dL (ref <150)

## 2022-07-16 ENCOUNTER — Telehealth: Payer: Self-pay

## 2022-07-16 NOTE — Telephone Encounter (Signed)
Left message for patient to call office.  

## 2022-07-16 NOTE — Telephone Encounter (Signed)
Please advise 

## 2022-07-16 NOTE — Telephone Encounter (Signed)
Pt is calling Bonnita Nasuti back Please advise

## 2022-07-16 NOTE — Telephone Encounter (Signed)
Pt given lab results per notes of Almyra Free, NP on 07/16/22. Pt verbalized understanding. Pt is requesting Ativan back. Pt states she took the buspar and almost had to go to ED d/t increased HR and anxiety is very high right now. Pt is asking if she can please get that sent in today. Advised pt I would send back for review HP.

## 2022-07-17 NOTE — Telephone Encounter (Signed)
Can you prescribe anything else for mild anxiety. Do not want a psychiatry referral  [place]

## 2022-07-17 NOTE — Telephone Encounter (Signed)
LVM for patient to call back in order to relay medication response from provider Serafina Royals, NP.   Please see response from Serafina Royals, NP

## 2022-07-18 DIAGNOSIS — H209 Unspecified iridocyclitis: Secondary | ICD-10-CM | POA: Diagnosis not present

## 2022-07-19 ENCOUNTER — Ambulatory Visit: Payer: Self-pay | Admitting: *Deleted

## 2022-07-19 NOTE — Telephone Encounter (Signed)
Message read to pt. States she is agreeable to start Lexapro and stop Paxil. Does states she did not take Paxil every day. Also questioning what she should take for acute anxiety for ore immediate relief if she cannot have Ativan.  Please advise.

## 2022-08-10 DIAGNOSIS — H209 Unspecified iridocyclitis: Secondary | ICD-10-CM | POA: Diagnosis not present

## 2022-09-21 DIAGNOSIS — H209 Unspecified iridocyclitis: Secondary | ICD-10-CM | POA: Diagnosis not present

## 2022-10-17 DIAGNOSIS — Z961 Presence of intraocular lens: Secondary | ICD-10-CM | POA: Diagnosis not present

## 2022-10-17 DIAGNOSIS — H35372 Puckering of macula, left eye: Secondary | ICD-10-CM | POA: Diagnosis not present

## 2022-10-17 DIAGNOSIS — H5213 Myopia, bilateral: Secondary | ICD-10-CM | POA: Diagnosis not present

## 2022-10-17 NOTE — Progress Notes (Unsigned)
Name: Joyce Pace   MRN: [id number]    DOB: [birth year]   Date:10/18/2022       Progress Note  Subjective  Chief Complaint  Chief Complaint  Patient presents with   Annual Exam    HPI  Patient presents for annual CPE. Patient is aware of possible additional charge due to discussing other complaints.   Benign positional vertigo: patient reports she has had for many years, she says specifically when she turns her head to the left. she has previously on antivert she says it only helped a little.  She says that she has never seen ENT will place referral.    Constipation/gas: patient reports she has continued to have constipation and gas.  She says she does have to take a colon cleanse tablet to go to the bathroom. She says she has been eating lots of fiber but is still having issues. She is up to date on her colonoscopy.  Recommend follow up with GI.   Diet: well balanced, does not eat red meat Exercise: walks daily thirty minutes Sleep: 5-6 hours Last dental exam:unknown, she is going to schedule Last eye exam: this year  [place] Visit from 10/18/2022 in [facility]  AUDIT-C Score 0      Depression: Phq 9 is  positive, she was given a prescription for paxil 10 mg daily back in September.  At that appointment she was taking paxil as needed and not daily. We had discussed that she needed to take it daily.  She had reported that she had recently lost her husband and mother and that it has been difficult. Patient reports that she has not been taking the paxil as prescribed. She says she has not been taking it everyday.  She says she has tried counseling but it did not help.  Discussed trying a different SSRI she would like to try zoloft. Stop paxil and start her on zoloft and have her follow up in 4 weeks.     10/18/2022   11:02 AM 07/13/2022    9:36 AM 10/16/2021    1:51 PM 07/01/2020   10:55 AM 07/01/2019   10:20 AM  Depression screen PHQ 2/9   Decreased Interest 1 0 1 0 1  Down, Depressed, Hopeless 2 0 0 1 1  PHQ - 2 Score 3 0 _0 Altered sleeping 3 1 0 1 0  Tired, decreased energy 0 0 1 0 1  Change in appetite 3 0 0 0 0  Feeling bad or failure about yourself  3 0 0 1 0  Trouble concentrating 3 0 0 0 0  Moving slowly or fidgety/restless 3 0 0 0 0  Suicidal thoughts 3 0 0 0 0  PHQ-9 Score _1 Difficult doing work/chores Very difficult Not difficult at all  Not difficult at all Not difficult at all   Hypertension: BP Readings from Last 3 Encounters:  10/18/22 120/70  07/13/22 130/82  05/15/22 (!) 142/71   Obesity: Wt Readings from Last 3 Encounters:  10/18/22 147 lb 9.6 oz (67 kg)  07/13/22 144 lb 6.4 oz (65.5 kg)  05/15/22 147 lb (66.7 kg)   BMI Readings from Last 3 Encounters:  10/18/22 26.15 kg/m  07/13/22 25.58 kg/m  05/15/22 26.89 kg/m     Vaccines:  HPV: up to at age 52 , ask insurance if age between 29-45  Shingrix: 75-64 yo and ask insurance if covered when patient above 32 yo  Pneumonia:  educated and discussed with patient. Flu:  educated and discussed with patient.  Hep C Screening: 06/19/2018 STD testing and prevention (HIV/chl/gon/syphilis): declined Intimate partner violence:none Sexual History :not sexually active Menstrual History/LMP/Abnormal Bleeding: postmenopausal  Incontinence Symptoms: none  Breast cancer:  - Last Mammogram: 01/22/2022 - BRCA gene screening: none  Osteoporosis: Discussed high calcium and vitamin D supplementation, weight bearing exercises, bone density 2023  Cervical cancer screening: aged out  Skin cancer: Discussed monitoring for atypical lesions  Colorectal cancer: 06/12/2018   Lung cancer:   Low Dose CT Chest recommended if Age 79-80 years, 20 pack-year currently smoking OR have quit w/in 15years. Patient does not qualify.   ECG: 05/22/2021  Advanced Care Planning: A voluntary discussion about advance care planning including the explanation and  discussion of advance directives.  Discussed health care proxy and Living will, and the patient was able to identify a health care proxy as daughter.  Patient does not have a living will at present time. If patient does have living will, I have requested they bring this to the clinic to be scanned in to their chart.  Lipids: Lab Results  Component Value Date   CHOL 149 07/13/2022   CHOL 146 10/16/2021   CHOL 209 (H) 07/01/2020   Lab Results  Component Value Date   HDL 51 07/13/2022   HDL 57 10/16/2021   HDL 60 07/01/2020   Lab Results  Component Value Date   LDLCALC 80 07/13/2022   LDLCALC 68 10/16/2021   LDLCALC 127 (H) 07/01/2020   Lab Results  Component Value Date   TRIG 101 07/13/2022   TRIG 126 10/16/2021   TRIG 117 07/01/2020   Lab Results  Component Value Date   CHOLHDL 2.9 07/13/2022   CHOLHDL 2.6 10/16/2021   CHOLHDL 3.5 07/01/2020   No results found for: "LDLDIRECT"  Glucose: Glucose, Bld  Date Value Ref Range Status  07/13/2022 78 65 - 99 mg/dL Final    Comment:    .            Fasting reference interval .   10/16/2021 92 65 - 139 mg/dL Final    Comment:    .        Non-fasting reference interval .   05/23/2021 185 (H) 70 - 99 mg/dL Final    Comment:    Glucose reference range applies only to samples taken after fasting for at least 8 hours.    Patient Active Problem List   Diagnosis Date Noted   Gastroesophageal reflux disease without esophagitis 07/13/2022   Recurrent major depressive disorder, in partial remission ([place]) 07/13/2022   Overweight with body mass index (BMI) of 25 to 25.9 in adult 10/16/2021   Barrett's esophagus without dysplasia    Essential hypertension 02/19/2018   Mixed hyperlipidemia 02/19/2018   History of colon cancer 02/19/2018   Anxiety 02/19/2018    Past Surgical History:  Procedure Laterality Date   CATARACT EXTRACTION W/PHACO Left 04/17/2022   Procedure: CATARACT EXTRACTION PHACO AND INTRAOCULAR LENS PLACEMENT  (IOC) LEFT 14.22 01:16.2;  Surgeon: Birder Robson, MD;  Location: [place];  Service: Ophthalmology;  Laterality: Left;   CATARACT EXTRACTION W/PHACO Right 05/15/2022   Procedure: CATARACT EXTRACTION PHACO AND INTRAOCULAR LENS PLACEMENT (IOC) RIGHT 12.96 01:08.2;  Surgeon: Birder Robson, MD;  Location: [place];  Service: Ophthalmology;  Laterality: Right;   CHOLECYSTECTOMY     COLON SURGERY  2016   colon resection   COLONOSCOPY WITH PROPOFOL N/A 06/12/2018  Procedure: COLONOSCOPY WITH PROPOFOL;  Surgeon: Virgel Manifold, MD;  Location: [facility];  Service: Endoscopy;  Laterality: N/A;   ESOPHAGOGASTRODUODENOSCOPY (EGD) WITH PROPOFOL N/A 06/12/2018   Procedure: ESOPHAGOGASTRODUODENOSCOPY (EGD) WITH PROPOFOL;  Surgeon: Virgel Manifold, MD;  Location: [facility];  Service: Endoscopy;  Laterality: N/A;   LAPAROSCOPIC APPENDECTOMY N/A 05/22/2021   Procedure: APPENDECTOMY LAPAROSCOPIC;  Surgeon: Fredirick Maudlin, MD;  Location: [facility];  Service: General;  Laterality: N/A;    Family History  Problem Relation Age of Onset   Hyperlipidemia Mother    Hypertension Mother    Diabetes Father    Hypertension Sister    Hyperlipidemia Sister    Hyperlipidemia Brother    Hypertension Brother    Ovarian cancer Daughter    Healthy Son    Heart disease Maternal Grandmother    Heart disease Maternal Grandfather    Heart disease Paternal Grandmother    Heart disease Paternal Grandfather    Hyperlipidemia Sister    Healthy Daughter     Social History   Socioeconomic History   Marital status: Widowed    Spouse name: Not on file   Number of children: 3   Years of education: Not on file   Highest education level: High school graduate  Occupational History   Occupation: retired  Tobacco Use   Smoking status: Never   Smokeless tobacco: Never  Vaping Use   Vaping Use: Never used  Substance and Sexual Activity   Alcohol use: No   Drug use: No    Sexual activity: Not Currently  Other Topics Concern   Not on file  Social History Narrative   Not on file   Social Determinants of Health   Financial Resource Strain: Low Risk  (10/18/2022)   Overall Financial Resource Strain (CARDIA)    Difficulty of Paying Living Expenses: Not hard at all  Food Insecurity: No Food Insecurity (10/18/2022)   Hunger Vital Sign    Worried About Running Out of Food in the Last Year: Never true    [place] in the Last Year: Never true  Transportation Needs: No Transportation Needs (10/18/2022)   PRAPARE - Hydrologist (Medical): No    Lack of Transportation (Non-Medical): No  Physical Activity: Sufficiently Active (10/18/2022)   Exercise Vital Sign    Days of Exercise per Week: 5 days    Minutes of Exercise per Session: 30 min  Stress: Stress Concern Present (10/18/2022)   [place]    Feeling of Stress : To some extent  Social Connections: Moderately Isolated (10/18/2022)   Social Connection and Isolation Panel [NHANES]    Frequency of Communication with Friends and Family: More than three times a week    Frequency of Social Gatherings with Friends and Family: More than three times a week    Attends Religious Services: More than 4 times per year    Active Member of [employer] or Organizations: No    Attends Archivist Meetings: Never    Marital Status: Widowed  Intimate Partner Violence: Not At Risk (10/18/2022)   Humiliation, Afraid, Rape, and Kick questionnaire    Fear of Current or Ex-Partner: No    Emotionally Abused: No    Physically Abused: No    Sexually Abused: No     Current Outpatient Medications:    busPIRone (BUSPAR) 5 MG tablet, Take 1 tablet (5 mg total) by mouth 3 (three) times daily., Disp:  90 tablet, Rfl: 0   lisinopril (ZESTRIL) 20 MG tablet, TAKE 1 TABLET(20 MG) BY MOUTH DAILY, Disp: 90 tablet, Rfl: 3   meclizine  (ANTIVERT) 25 MG tablet, Take 1 tablet (25 mg total) by mouth 3 (three) times daily as needed for dizziness., Disp: 30 tablet, Rfl: 0   omeprazole (PRILOSEC) 20 MG capsule, TAKE 1 CAPSULE(20 MG) BY MOUTH DAILY, Disp: 90 capsule, Rfl: 3   prednisoLONE acetate (PRED FORTE) 1 % ophthalmic suspension, SMARTSIG:In Eye(s), Disp: , Rfl:    sertraline (ZOLOFT) 25 MG tablet, Take 1 tablet (25 mg total) by mouth daily., Disp: 30 tablet, Rfl: 0   simvastatin (ZOCOR) 40 MG tablet, TAKE 1 TABLET(40 MG) BY MOUTH AT BEDTIME, Disp: 90 tablet, Rfl: 1  Allergies  Allergen Reactions   Flax Seed [Flax Seed Oil] Other (See Comments)    Throat gets itching and difficultly swallowing     ROS  Constitutional: Negative for fever or weight change.  Respiratory: Negative for cough and shortness of breath.   Cardiovascular: Negative for chest pain or palpitations.  Gastrointestinal: Negative for abdominal pain, no bowel changes.  Musculoskeletal: Negative for gait problem or joint swelling.  Skin: Negative for rash.  Neurological: Negative for dizziness or headache.  No other specific complaints in a complete review of systems (except as listed in HPI above).   Objective  Vitals:   10/18/22 1057  BP: 120/70  Pulse: 69  Resp: 16  Temp: 98.3 F (36.8 C)  TempSrc: Oral  SpO2: 99%  Weight: 147 lb 9.6 oz (67 kg)  Height: _0  (1.6 m)    Body mass index is 26.15 kg/m.  Physical Exam Constitutional: Patient appears well-developed and well-nourished. No distress.  HENT: Head: Normocephalic and atraumatic. Ears: B TMs ok, no erythema or effusion; Nose: Nose normal. Mouth/Throat: Oropharynx is clear and moist. No oropharyngeal exudate.  Eyes: Conjunctivae and EOM are normal. Pupils are equal, round, and reactive to light. No scleral icterus.  Neck: Normal range of motion. Neck supple. No JVD present. No thyromegaly present.  Cardiovascular: Normal rate, regular rhythm and normal heart sounds.  No murmur  heard. No BLE edema. Pulmonary/Chest: Effort normal and breath sounds normal. No respiratory distress. Abdominal: Soft. Bowel sounds are normal, no distension. There is no tenderness. no masses Musculoskeletal: Normal range of motion, no joint effusions. No gross deformities Neurological: he is alert and oriented to person, place, and time. No cranial nerve deficit. Coordination, balance, strength, speech and gait are normal.  Skin: Skin is warm and dry. No rash noted. No erythema.  Psychiatric: Patient has a normal mood and affect. behavior is normal. Judgment and thought content normal.   No results found for this or any previous visit (from the past 2160 hour(s)).   Fall Risk:    10/18/2022   10:56 AM 07/13/2022    9:35 AM 07/01/2020   10:55 AM 06/23/2019   10:10 AM 12/01/2018    9:59 AM  Fall Risk   Falls in the past year? 0 0 0 0 0  Number falls in past yr: 0 0 0  0  Injury with Fall? 0 0 0  0  Risk for fall due to :   No Fall Risks    Follow up Falls evaluation completed Falls evaluation completed Falls evaluation completed       Functional Status Survey: Is the patient deaf or have difficulty hearing?: No Does the patient have difficulty seeing, even when wearing glasses/contacts?: No Does the patient  have difficulty concentrating, remembering, or making decisions?: No Does the patient have difficulty walking or climbing stairs?: No Does the patient have difficulty dressing or bathing?: No Does the patient have difficulty doing errands alone such as visiting a doctor's office or shopping?: No   Assessment & Plan  1. Annual physical exam Up to date on labs Up to date on mammogram, bone density and colorectal cancer screening   2. Anxiety Stop paxil start zoloft - sertraline (ZOLOFT) 25 MG tablet; Take 1 tablet (25 mg total) by mouth daily.  Dispense: 30 tablet; Refill: 0  3. Moderate episode of recurrent major depressive disorder (HCC) Stop paxil start zoloft -  sertraline (ZOLOFT) 25 MG tablet; Take 1 tablet (25 mg total) by mouth daily.  Dispense: 30 tablet; Refill: 0   4. Benign positional vertigo, left -can try epley maneuver  - Ambulatory referral to ENT - meclizine (ANTIVERT) 25 MG tablet; Take 1 tablet (25 mg total) by mouth 3 (three) times daily as needed for dizziness.  Dispense: 30 tablet; Refill: 0   -USPSTF grade A and B recommendations reviewed with patient; age-appropriate recommendations, preventive care, screening tests, etc discussed and encouraged; healthy living encouraged; see AVS for patient education given to patient -Discussed importance of 150 minutes of physical activity weekly, eat two servings of fish weekly, eat one serving of tree nuts ( cashews, pistachios, pecans, almonds.Marland Kitchen) every other day, eat 6 servings of fruit/vegetables daily and drink plenty of water and avoid sweet beverages.

## 2022-10-18 ENCOUNTER — Encounter: Payer: Self-pay | Admitting: Nurse Practitioner

## 2022-10-18 ENCOUNTER — Ambulatory Visit (INDEPENDENT_AMBULATORY_CARE_PROVIDER_SITE_OTHER): Payer: Medicare Other | Admitting: Nurse Practitioner

## 2022-10-18 ENCOUNTER — Other Ambulatory Visit: Payer: Self-pay

## 2022-10-18 VITALS — BP 120/70 | HR 69 | Temp 98.3°F | Resp 16 | Ht 63.0 in | Wt 147.6 lb

## 2022-10-18 DIAGNOSIS — F331 Major depressive disorder, recurrent, moderate: Secondary | ICD-10-CM | POA: Diagnosis not present

## 2022-10-18 DIAGNOSIS — F419 Anxiety disorder, unspecified: Secondary | ICD-10-CM

## 2022-10-18 DIAGNOSIS — Z0000 Encounter for general adult medical examination without abnormal findings: Secondary | ICD-10-CM

## 2022-10-18 DIAGNOSIS — H8112 Benign paroxysmal vertigo, left ear: Secondary | ICD-10-CM | POA: Diagnosis not present

## 2022-10-18 MED ORDER — SERTRALINE HCL 25 MG PO TABS: 25.0000 mg | ORAL_TABLET | Freq: Every day | ORAL | 0 refills | Status: AC

## 2022-10-18 MED ORDER — MECLIZINE HCL 25 MG PO TABS: 25.0000 mg | ORAL_TABLET | Freq: Three times a day (TID) | ORAL | 0 refills | Status: AC | PRN

## 2022-11-02 ENCOUNTER — Telehealth (INDEPENDENT_AMBULATORY_CARE_PROVIDER_SITE_OTHER): Payer: Medicare Other | Admitting: Nurse Practitioner

## 2022-11-02 ENCOUNTER — Other Ambulatory Visit: Payer: Self-pay

## 2022-11-02 ENCOUNTER — Telehealth: Payer: Medicare Other | Admitting: Nurse Practitioner

## 2022-11-02 DIAGNOSIS — J069 Acute upper respiratory infection, unspecified: Secondary | ICD-10-CM | POA: Diagnosis not present

## 2022-11-02 MED ORDER — HYDROCOD POLI-CHLORPHE POLI ER 10-8 MG/5ML PO SUER: 5.0000 mL | Freq: Two times a day (BID) | ORAL | 0 refills | Status: DC | PRN

## 2022-11-02 MED ORDER — BENZONATATE 100 MG PO CAPS: 100.0000 mg | ORAL_CAPSULE | Freq: Two times a day (BID) | ORAL | 0 refills | Status: DC | PRN

## 2022-11-02 MED ORDER — AZITHROMYCIN 250 MG PO TABS: ORAL_TABLET | ORAL | 0 refills | Status: AC

## 2022-11-02 NOTE — Progress Notes (Signed)
Name: Joyce Pace   MRN: [id number]    DOB: 09/30/1951   Date:11/02/2022       Progress Note  Subjective  Chief Complaint  Chief Complaint  Patient presents with   Cough    Fever, chills, sore throat for 6 days    I connected with  Mariane Duval  on 11/02/22 at 09:15 am by a telephone enabled telemedicine application and verified that I am speaking with the correct person using two identifiers.  I discussed the limitations of evaluation and management by telemedicine and the availability of in person appointments. The patient expressed understanding and agreed to proceed with a virtual visit  Staff also discussed with the patient that there may be a patient responsible charge related to this service. Patient Location: home Provider Location: [facility] Additional Individuals present: alone  HPI  URI: patient reports she has had fever, chills, nasal congestion, sore throat and cough for six days. She denies any shortness of breath.   Deferred testing due to outside of treatment window.  Patient reports she has been taking tylenol cold and flu. Recommend she start vitamin c, d and zinc.  She can also take flonase, mucinex, and zyrtec.  Discussed this is likely viral and does not need antibiotics.  Will send in zpack to use if no improvement in the next few days. Will send in tussinex, tessalon perls.  Push fluids and get rest   Patient Active Problem List   Diagnosis Date Noted   Gastroesophageal reflux disease without esophagitis 07/13/2022   Recurrent major depressive disorder, in partial remission ([place]) 07/13/2022   Overweight with body mass index (BMI) of 25 to 25.9 in adult 10/16/2021   Barrett's esophagus without dysplasia    Essential hypertension 02/19/2018   Mixed hyperlipidemia 02/19/2018   History of colon cancer 02/19/2018   Anxiety 02/19/2018    Social History   Tobacco Use   Smoking status: Never   Smokeless tobacco: Never  Substance Use Topics   Alcohol  use: No     Current Outpatient Medications:    busPIRone (BUSPAR) 5 MG tablet, Take 1 tablet (5 mg total) by mouth 3 (three) times daily., Disp: 90 tablet, Rfl: 0   lisinopril (ZESTRIL) 20 MG tablet, TAKE 1 TABLET(20 MG) BY MOUTH DAILY, Disp: 90 tablet, Rfl: 3   meclizine (ANTIVERT) 25 MG tablet, Take 1 tablet (25 mg total) by mouth 3 (three) times daily as needed for dizziness., Disp: 30 tablet, Rfl: 0   omeprazole (PRILOSEC) 20 MG capsule, TAKE 1 CAPSULE(20 MG) BY MOUTH DAILY, Disp: 90 capsule, Rfl: 3   prednisoLONE acetate (PRED FORTE) 1 % ophthalmic suspension, SMARTSIG:In Eye(s), Disp: , Rfl:    sertraline (ZOLOFT) 25 MG tablet, Take 1 tablet (25 mg total) by mouth daily., Disp: 30 tablet, Rfl: 0   simvastatin (ZOCOR) 40 MG tablet, TAKE 1 TABLET(40 MG) BY MOUTH AT BEDTIME, Disp: 90 tablet, Rfl: 1  Allergies  Allergen Reactions   Flax Seed [Flax Seed Oil] Other (See Comments)    Throat gets itching and difficultly swallowing    I personally reviewed active problem list, medication list, allergies with the patient/caregiver today.  ROS  Constitutional: positive  for fever or  Negativeweight change.  HEENT: positive for nasal congestion, sore throat Respiratory: positive  for cough and  Negative shortness of breath.   Cardiovascular: Negative for chest pain or palpitations.  Gastrointestinal: Negative for abdominal pain, no bowel changes.  Musculoskeletal: Negative for gait problem or joint swelling.  Skin: Negative for rash.  Neurological: Negative for dizziness or headache.  No other specific complaints in a complete review of systems (except as listed in HPI above).   Objective  Virtual encounter, vitals not obtained.  There is no height or weight on file to calculate BMI.  Nursing Note and Vital Signs reviewed.  Physical Exam  Awake, alert and oriented, speaking in complete sentences  No results found for this or any previous visit (from the past 72  hour(s)).  Assessment & Plan  1. Viral upper respiratory tract infection Push fluids get rest Start vitamin c, d and zinc Start flonase, zyrtec and mucinex - azithromycin (ZITHROMAX) 250 MG tablet; Take 2 tablets on day 1, then 1 tablet daily on days 2 through 5  Dispense: 6 tablet; Refill: 0 - benzonatate (TESSALON) 100 MG capsule; Take 1 capsule (100 mg total) by mouth 2 (two) times daily as needed for cough.  Dispense: 20 capsule; Refill: 0 - chlorpheniramine-HYDROcodone (TUSSIONEX) 10-8 MG/5ML; Take 5 mLs by mouth every 12 (twelve) hours as needed for cough.  Dispense: 115 mL; Refill: 0   -Red flags and when to present for emergency care or RTC including fever >101.12F, chest pain, shortness of breath, new/worsening/un-resolving symptoms,  reviewed with patient at time of visit. Follow up and care instructions discussed and provided in AVS. - I discussed the assessment and treatment plan with the patient. The patient was provided an opportunity to ask questions and all were answered. The patient agreed with the plan and demonstrated an understanding of the instructions.  I provided 20 minutes of non-face-to-face time during this encounter.  Bo Merino, FNP

## 2022-11-07 ENCOUNTER — Ambulatory Visit: Payer: Self-pay | Admitting: *Deleted

## 2022-11-07 NOTE — Telephone Encounter (Signed)
Reason for Disposition  [1] Continuous (nonstop) coughing interferes with work or school AND [2] no improvement using cough treatment per Care Advice  Answer Assessment - Initial Assessment Questions 1. ONSET: "When did the cough begin?"      Over 2 weeks 2. SEVERITY: "How bad is the cough today?"      Unable to lay down- constant cough 3. SPUTUM: "Describe the color of your sputum" (none, dry cough; clear, white, yellow, green)     Loose but not coming up- white 4. HEMOPTYSIS: "Are you coughing up any blood?" If so ask: "How much?" (flecks, streaks, tablespoons, etc.)     no 5. DIFFICULTY BREATHING: "Are you having difficulty breathing?" If Yes, ask: "How bad is it?" (e.g., mild, moderate, severe)    - MILD: No SOB at rest, mild SOB with walking, speaks normally in sentences, can lie down, no retractions, pulse < 100.    - MODERATE: SOB at rest, SOB with minimal exertion and prefers to sit, cannot lie down flat, speaks in phrases, mild retractions, audible wheezing, pulse 100-120.    - SEVERE: Very SOB at rest, speaks in single words, struggling to breathe, sitting hunched forward, retractions, pulse > 120      Labored- has to take deep breaths- fatigued 6. FEVER: "Do you have a fever?" If Yes, ask: "What is your temperature, how was it measured, and when did it start?"     No- did have fever 7. CARDIAC HISTORY: "Do you have any history of heart disease?" (e.g., heart attack, congestive heart failure)      no 8. LUNG HISTORY: "Do you have any history of lung disease?"  (e.g., pulmonary embolus, asthma, emphysema)     no 9. PE RISK FACTORS: "Do you have a history of blood clots?" (or: recent major surgery, recent prolonged travel, bedridden)     no 10. OTHER SYMPTOMS: "Do you have any other symptoms?" (e.g., runny nose, wheezing, chest pain)       Wheezing, chest tightness  Protocols used: Cough - Acute Non-Productive-A-AH

## 2022-11-07 NOTE — Telephone Encounter (Signed)
  Chief Complaint: non productive cough Symptoms: patient was seen  Frequency:  virtual 12/29 and treated for cough- with antibiotic- not better Pertinent Negatives: Patient denies fever Disposition: '[]'$ ED /'[]'$ Urgent Care (no appt availability in office) / '[x]'$ Appointment(In office/virtual)/ '[]'$  [facility]/ '[]'$ Home Care/ '[]'$ Refused Recommended Disposition /'[]'$ [facility]/ '[]'$  Follow-up with PCP Additional Notes: Patient has been scheduled for next day appointment- due to late hour- advised to go to ED if she should get worse in any way- patient states she understands

## 2022-11-08 ENCOUNTER — Other Ambulatory Visit: Payer: Self-pay

## 2022-11-08 ENCOUNTER — Encounter: Payer: Self-pay | Admitting: Nurse Practitioner

## 2022-11-08 ENCOUNTER — Ambulatory Visit
Admission: RE | Admit: 2022-11-08 | Discharge: 2022-11-08 | Disposition: A | Discharge status: Home / Self Care | Payer: Medicare Other | Attending: Nurse Practitioner | Admitting: Nurse Practitioner

## 2022-11-08 ENCOUNTER — Telehealth: Payer: Self-pay | Admitting: Nurse Practitioner

## 2022-11-08 ENCOUNTER — Ambulatory Visit
Admission: RE | Admit: 2022-11-08 | Discharge: 2022-11-08 | Disposition: A | Discharge status: Home / Self Care | Payer: Medicare Other | Source: Ambulatory Visit | Attending: Nurse Practitioner | Admitting: Nurse Practitioner

## 2022-11-08 ENCOUNTER — Ambulatory Visit (INDEPENDENT_AMBULATORY_CARE_PROVIDER_SITE_OTHER): Payer: Medicare Other | Admitting: Nurse Practitioner

## 2022-11-08 VITALS — BP 120/84 | HR 97 | Temp 98.9°F | Resp 16 | Ht 63.0 in | Wt 142.8 lb

## 2022-11-08 DIAGNOSIS — R053 Chronic cough: Secondary | ICD-10-CM | POA: Diagnosis not present

## 2022-11-08 DIAGNOSIS — H209 Unspecified iridocyclitis: Secondary | ICD-10-CM | POA: Diagnosis not present

## 2022-11-08 DIAGNOSIS — R059 Cough, unspecified: Secondary | ICD-10-CM | POA: Diagnosis not present

## 2022-11-08 DIAGNOSIS — R0602 Shortness of breath: Secondary | ICD-10-CM | POA: Diagnosis not present

## 2022-11-08 DIAGNOSIS — H40053 Ocular hypertension, bilateral: Secondary | ICD-10-CM | POA: Diagnosis not present

## 2022-11-08 MED ORDER — PROMETHAZINE-DM 6.25-15 MG/5ML PO SYRP: 5.0000 mL | ORAL_SOLUTION | Freq: Four times a day (QID) | ORAL | 0 refills | Status: DC | PRN

## 2022-11-08 MED ORDER — PREDNISONE 10 MG (21) PO TBPK: ORAL_TABLET | ORAL | 0 refills | Status: AC

## 2022-11-08 NOTE — Telephone Encounter (Signed)
Will send as soon as they come back

## 2022-11-08 NOTE — Progress Notes (Signed)
BP 120/84   Pulse 97   Temp 98.9 F (37.2 C) (Oral)   Resp 16   Ht _0  (1.6 m)   Wt 142 lb 12.8 oz (64.8 kg)   SpO2 99%   BMI 25.30 kg/m    Subjective:    Patient ID: Joyce Pace, female    DOB: 11/01/1951, 72 y.o.   MRN: [id number]  HPI: Lloyd Cullinan is a 72 y.o. female  Chief Complaint  Patient presents with   Cough    Congested, hoarse for 2 weeks   Eye Problem    Eyes red, not sure if its due to her being sick or previous cataract surgery 3 months ago   URI/ continued cough: patient had virtual visit on 11/02/2022 for URI symptoms that included fever, chills, nasal congestion, sore throat and cough.  She was outside the treatment window for covid and flu, testing was deferred.  Patient reported she had been taking tylenol cold and flu.  Recommend patient start taking vitamin c, d and zinc, flonase, mucinex, and zyrtec.  Patient was also given a prescription for azithromycin, tessalon perls and tussinex.  Patient reports she finished the azithromycin but is still feeling sick.  She still has a cough. She says she does get a little short of breath.     Eye pain/redness: she says that her eyes have been red and painful since cataract surgery three months ago. Recommend patient call eye doctor for further evaluation. Patient verbalized understanding.     Relevant past medical, surgical, family and social history reviewed and updated as indicated. Interim medical history since our last visit reviewed. Allergies and medications reviewed and updated.  Review of Systems  Constitutional: positive for fever , negative for  weight change. Respiratory: positive for cough and shortness of breath.   Cardiovascular: Negative for chest pain or palpitations.  Gastrointestinal: Negative for abdominal pain, no bowel changes.  Musculoskeletal: Negative for gait problem or joint swelling.  Skin: Negative for rash.  Neurological: Negative for dizziness or headache.  No  other specific complaints in a complete review of systems (except as listed in HPI above).      Objective:    BP 120/84   Pulse 97   Temp 98.9 F (37.2 C) (Oral)   Resp 16   Ht _1  (1.6 m)   Wt 142 lb 12.8 oz (64.8 kg)   SpO2 99%   BMI 25.30 kg/m   Wt Readings from Last 3 Encounters:  11/08/22 142 lb 12.8 oz (64.8 kg)  10/18/22 147 lb 9.6 oz (67 kg)  07/13/22 144 lb 6.4 oz (65.5 kg)    Physical Exam  Constitutional: Patient appears well-developed and well-nourished.  No distress.  HEENT: head atraumatic, normocephalic, pupils equal and reactive to light, ears TMs clear, neck supple, throat within normal limits Cardiovascular: Normal rate, regular rhythm and normal heart sounds.  No murmur heard. No BLE edema. Pulmonary/Chest: Effort normal and breath sounds rhonchi. No respiratory distress. Abdominal: Soft.  There is no tenderness. Psychiatric: Patient has a normal mood and affect. behavior is normal. Judgment and thought content normal.  Results for orders placed or performed in visit on 07/13/22  Lipid panel  Result Value Ref Range   Cholesterol 149 <200 mg/dL   HDL 51 > OR = 50 mg/dL   Triglycerides 101 <150 mg/dL   LDL Cholesterol (Calc) 80 mg/dL (calc)   Total CHOL/HDL Ratio 2.9 <5.0 (calc)   Non-HDL Cholesterol (Calc) 98 <130 mg/dL (calc)  CBC with Differential/Platelet  Result Value Ref Range   WBC 4.4 3.8 - 10.8 Thousand/uL   RBC 5.05 3.80 - 5.10 Million/uL   Hemoglobin 12.8 11.7 - 15.5 g/dL   HCT 39.7 35.0 - 45.0 %   MCV 78.6 (L) 80.0 - 100.0 fL   MCH 25.3 (L) 27.0 - 33.0 pg   MCHC 32.2 32.0 - 36.0 g/dL   RDW 13.1 11.0 - 15.0 %   Platelets 206 140 - 400 Thousand/uL   MPV 11.4 7.5 - 12.5 fL   Neutro Abs 2,517 1,500 - 7,800 cells/uL   Lymphs Abs 1,267 850 - 3,900 cells/uL   Absolute Monocytes 405 200 - 950 cells/uL   Eosinophils Absolute 180 15 - 500 cells/uL   Basophils Absolute 31 0 - 200 cells/uL   Neutrophils Relative % 57.2 %   Total Lymphocyte  28.8 %   Monocytes Relative 9.2 %   Eosinophils Relative 4.1 %   Basophils Relative 0.7 %  COMPLETE METABOLIC PANEL WITH GFR  Result Value Ref Range   Glucose, Bld 78 65 - 99 mg/dL   BUN 16 7 - 25 mg/dL   Creat 0.86 0.50 - 1.05 mg/dL   eGFR 73 > OR = 60 mL/min/1.84m   BUN/Creatinine Ratio SEE NOTE: 6 - 22 (calc)   Sodium 140 135 - 146 mmol/L   Potassium 4.8 3.5 - 5.3 mmol/L   Chloride 105 98 - 110 mmol/L   CO2 28 20 - 32 mmol/L   Calcium 9.3 8.6 - 10.4 mg/dL   Total Protein 6.7 6.1 - 8.1 g/dL   Albumin 4.0 3.6 - 5.1 g/dL   Globulin 2.7 1.9 - 3.7 g/dL (calc)   AG Ratio 1.5 1.0 - 2.5 (calc)   Total Bilirubin 1.3 (H) 0.2 - 1.2 mg/dL   Alkaline phosphatase (APISO) 85 37 - 153 U/L   AST 15 10 - 35 U/L   ALT 13 6 - 29 U/L      Assessment & Plan:   Problem List Items Addressed This Visit   None Visit Diagnoses     Persistent cough    -  Primary   will get xray, start steroid taper and phenergan DM for cough.   Relevant Medications   promethazine-dextromethorphan (PROMETHAZINE-DM) 6.25-15 MG/5ML syrup   predniSONE (STERAPRED UNI-PAK 21 TAB) 10 MG (21) TBPK tablet   Other Relevant Orders   DG Chest 2 View        Follow up plan: Return if symptoms worsen or fail to improve.

## 2022-11-08 NOTE — Telephone Encounter (Signed)
Copied from [place] [phone]. Topic: General - Other >> Nov 08, 2022  1:54 PM Chapman Fitch wrote: Reason for CRM: Pts eye Dr. / Dr. Gwyndolyn Saxon Portfolio at [facility] would like a copy of the pts chest Xray results from today / please advise

## 2022-11-09 ENCOUNTER — Telehealth: Payer: Self-pay | Admitting: Nurse Practitioner

## 2022-11-09 ENCOUNTER — Other Ambulatory Visit
Admission: RE | Admit: 2022-11-09 | Discharge: 2022-11-09 | Disposition: A | Discharge status: Home / Self Care | Payer: Medicare Other | Attending: Ophthalmology | Admitting: Ophthalmology

## 2022-11-09 DIAGNOSIS — H209 Unspecified iridocyclitis: Secondary | ICD-10-CM | POA: Diagnosis not present

## 2022-11-09 LAB — CBC WITH DIFFERENTIAL/PLATELET
Abs Immature Granulocytes: 0.02 10*3/uL (ref 0.00–0.07)
Basophils Absolute: 0 10*3/uL (ref 0.0–0.1)
Basophils Relative: 0 %
Eosinophils Absolute: 0.1 10*3/uL (ref 0.0–0.5)
Eosinophils Relative: 2 %
HCT: 42.2 % (ref 36.0–46.0)
Hemoglobin: 13.1 g/dL (ref 12.0–15.0)
Immature Granulocytes: 0 %
Lymphocytes Relative: 20 %
Lymphs Abs: 1.2 10*3/uL (ref 0.7–4.0)
MCH: 24.3 pg — ABNORMAL LOW (ref 26.0–34.0)
MCHC: 31 g/dL (ref 30.0–36.0)
MCV: 78.1 fL — ABNORMAL LOW (ref 80.0–100.0)
Monocytes Absolute: 0.2 10*3/uL (ref 0.1–1.0)
Monocytes Relative: 3 %
Neutro Abs: 4.4 10*3/uL (ref 1.7–7.7)
Neutrophils Relative %: 75 %
Platelets: 250 10*3/uL (ref 150–400)
RBC: 5.4 MIL/uL — ABNORMAL HIGH (ref 3.87–5.11)
RDW: 13.2 % (ref 11.5–15.5)
WBC: 5.9 10*3/uL (ref 4.0–10.5)
nRBC: 0 % (ref 0.0–0.2)

## 2022-11-09 NOTE — Telephone Encounter (Signed)
Cxr faxed to [facility]

## 2022-11-09 NOTE — Telephone Encounter (Signed)
Patient notified

## 2022-11-09 NOTE — Telephone Encounter (Unsigned)
Copied from [place] [id number]. Topic: General - Other >> Nov 09, 2022 10:11 AM Chapman Fitch wrote: Reason for CRM: Pt would like a call to go over Xray result / please advise

## 2022-11-10 LAB — RPR: RPR Ser Ql: NONREACTIVE

## 2022-11-14 LAB — QUANTIFERON-TB GOLD PLUS (RQFGPL)
QuantiFERON Mitogen Value: 2.49 IU/mL
QuantiFERON Nil Value: 0 IU/mL
QuantiFERON TB1 Ag Value: 0 IU/mL
QuantiFERON TB2 Ag Value: 0.01 IU/mL

## 2022-11-14 LAB — QUANTIFERON-TB GOLD PLUS: QuantiFERON-TB Gold Plus: NEGATIVE

## 2022-11-14 LAB — ANA: Anti Nuclear Antibody (ANA): NEGATIVE

## 2022-11-14 LAB — ACETYLCHOLINE RECEPTOR, BINDING: Acety choline binding ab: <0.03 nmol/L (ref 0.00–0.24)

## 2022-11-16 DIAGNOSIS — H40053 Ocular hypertension, bilateral: Secondary | ICD-10-CM | POA: Diagnosis not present

## 2022-11-16 DIAGNOSIS — H209 Unspecified iridocyclitis: Secondary | ICD-10-CM | POA: Diagnosis not present

## 2022-11-20 LAB — HLA-B27 ANTIGEN: HLA-B27: NEGATIVE

## 2022-12-18 ENCOUNTER — Other Ambulatory Visit: Payer: Self-pay | Admitting: Nurse Practitioner

## 2022-12-18 DIAGNOSIS — F419 Anxiety disorder, unspecified: Secondary | ICD-10-CM

## 2022-12-18 DIAGNOSIS — F330 Major depressive disorder, recurrent, mild: Secondary | ICD-10-CM

## 2022-12-19 NOTE — Telephone Encounter (Signed)
Will refuse due to medication changed by provider, discontinued on 10/18/22.   Requested Prescriptions  Pending Prescriptions Disp Refills   PARoxetine (PAXIL) 10 MG tablet [Pharmacy Med Name: PAROXETINE 10MG TABLETS] 30 tablet 0    Sig: TAKE 1 TABLET BY MOUTH DAILY     Psychiatry:  Antidepressants - SSRI Passed - 12/18/2022  4:26 PM      Passed - Completed PHQ-2 or PHQ-9 in the last 360 days      Passed - Valid encounter within last 6 months    Recent Outpatient Visits           1 month ago Persistent cough   [facility] Bo Merino, FNP   1 month ago Viral upper respiratory tract infection   [place], FNP   2 months ago Annual physical exam   [facility] Bo Merino, FNP   5 months ago Essential hypertension   [facility] Bo Merino, FNP   1 year ago Encounter for general adult medical examination with abnormal findings   [facility] [place], Coralie Keens, NP

## 2022-12-20 DIAGNOSIS — H30002 Unspecified focal chorioretinal inflammation, left eye: Secondary | ICD-10-CM | POA: Diagnosis not present

## 2022-12-20 DIAGNOSIS — H35352 Cystoid macular degeneration, left eye: Secondary | ICD-10-CM | POA: Diagnosis not present

## 2022-12-20 DIAGNOSIS — H35372 Puckering of macula, left eye: Secondary | ICD-10-CM | POA: Diagnosis not present

## 2023-01-02 ENCOUNTER — Other Ambulatory Visit: Payer: Self-pay | Admitting: Nurse Practitioner

## 2023-01-02 DIAGNOSIS — I10 Essential (primary) hypertension: Secondary | ICD-10-CM

## 2023-01-02 DIAGNOSIS — E782 Mixed hyperlipidemia: Secondary | ICD-10-CM

## 2023-01-02 MED ORDER — LISINOPRIL 20 MG PO TABS: ORAL_TABLET | ORAL | 3 refills | Status: DC

## 2023-01-02 MED ORDER — SIMVASTATIN 40 MG PO TABS: ORAL_TABLET | ORAL | 1 refills | Status: AC

## 2023-01-02 NOTE — Telephone Encounter (Signed)
Patient called, left VM to return the call to the office. Paroxetine was d/c by provider on 10/18/22 and changed to sertraline. Is she wanting to restart paroxetine?

## 2023-01-02 NOTE — Telephone Encounter (Signed)
Requested Prescriptions  Pending Prescriptions Disp Refills   simvastatin (ZOCOR) 40 MG tablet 90 tablet 1    Sig: TAKE 1 TABLET(40 MG) BY MOUTH AT BEDTIME     Cardiovascular:  Antilipid - Statins Failed - 01/02/2023  4:27 PM      Failed - Lipid Panel in normal range within the last 12 months    Cholesterol  Date Value Ref Range Status  07/13/2022 149 <200 mg/dL Final   LDL Cholesterol (Calc)  Date Value Ref Range Status  07/13/2022 80 mg/dL (calc) Final    Comment:    Reference range: <100 . Desirable range <100 mg/dL for primary prevention;   <70 mg/dL for patients with CHD or diabetic patients  with > or = 2 CHD risk factors. Marland Kitchen LDL-C is now calculated using the Martin-Hopkins  calculation, which is a validated novel method providing  better accuracy than the Friedewald equation in the  estimation of LDL-C.  Cresenciano Genre et al. Annamaria Helling. [id number]): 2061-2068  (http://education.[employer].com/faq/FAQ164)    HDL  Date Value Ref Range Status  07/13/2022 51 > OR = 50 mg/dL Final   Triglycerides  Date Value Ref Range Status  07/13/2022 101 <150 mg/dL Final         Passed - Patient is not pregnant      Passed - Valid encounter within last 12 months    Recent Outpatient Visits           1 month ago Persistent cough   [facility] Serafina Royals F, FNP   2 months ago Viral upper respiratory tract infection   [facility] Bo Merino, FNP   2 months ago Annual physical exam   [facility] Bo Merino, FNP   5 months ago Essential hypertension   [facility] Bo Merino, FNP   1 year ago Encounter for general adult medical examination with abnormal findings   [facility] [place], Coralie Keens, NP               lisinopril (ZESTRIL) 20 MG tablet 90 tablet 3    Sig: TAKE 1 TABLET(20 MG) BY MOUTH DAILY     Cardiovascular:  ACE  Inhibitors Passed - 01/02/2023  4:27 PM      Passed - Cr in normal range and within 180 days    Creat  Date Value Ref Range Status  07/13/2022 0.86 0.50 - 1.05 mg/dL Final         Passed - K in normal range and within 180 days    Potassium  Date Value Ref Range Status  07/13/2022 4.8 3.5 - 5.3 mmol/L Final         Passed - Patient is not pregnant      Passed - Last BP in normal range    BP Readings from Last 1 Encounters:  11/08/22 120/84         Passed - Valid encounter within last 6 months    Recent Outpatient Visits           1 month ago Persistent cough   [facility] Bo Merino, FNP   2 months ago Viral upper respiratory tract infection   [place], FNP   2 months ago Annual physical exam   [facility] Bo Merino, FNP   5 months ago Essential hypertension  [facility] Bo Merino, FNP   1 year ago Encounter for general adult medical examination with abnormal findings   [facility] [place], Coralie Keens, [place]

## 2023-01-02 NOTE — Telephone Encounter (Signed)
Medication Refill - Medication: simvastatin (ZOCOR) 40 MG tablet [id number]  lisinopril (ZESTRIL) 20 MG tablet  paroxetine 10 mg   Has the patient contacted their pharmacy? No. Pt is out of town in [place] and is wanting the medication to be sent to [facility] in [place].   Preferred Pharmacy (with phone number or street name):  [id number] [place], [place], [address] Phone: [phone]  Fax: [phone]     Has the patient been seen for an appointment in the last year OR does the patient have an upcoming appointment? Yes.    Agent: Please be advised that RX refills may take up to 3 business days. We ask that you follow-up with your pharmacy.

## 2023-01-03 ENCOUNTER — Ambulatory Visit: Payer: Self-pay

## 2023-01-03 NOTE — Telephone Encounter (Signed)
Light headed this morning, BP was 190/89 now it is 198/92. Patient refused ER at this time.  Offered to make her appointment for 3/1 and she stated  she is in [place] with family. Patient advised to go to ER or Urgent Care for work up

## 2023-01-03 NOTE — Telephone Encounter (Signed)
Pt called in returning call. Pt says that she doesn't take the Zoloft because it made her sick. Pt says that she only take Paroxetine instead. Pt would like to know if provider will send it in to her pharmacy.    Pharmacy: [id number] [place], [place], [address] AT [address]

## 2023-01-03 NOTE — Telephone Encounter (Signed)
Spoke to patient again and she checked her BP 238/106. Patient notified again to go ER. She stated she would good when her daughter get off after 5

## 2023-01-03 NOTE — Telephone Encounter (Signed)
Pt called back, asked if she can take 1/2 of her Lisinopril for her BP. I advised her based on her previous readings and sx going to ED or UC would be best and that I could not advise it ok for her to take extra medication. Pt states she was going to recheck BP and has no way to ED or UC until 5-6PM.

## 2023-01-03 NOTE — Telephone Encounter (Signed)
Chief Complaint: Hypertension Symptoms: lightheaded Frequency: onset this morning Pertinent Negatives: Patient denies other symptoms-blurred vision, CP, weakness, unsteady gait Disposition: '[x]'$ ED /'[]'$ Urgent Care (no appt availability in office) / '[]'$ Appointment(In office/virtual)/ '[]'$  [place] [facility]/ '[]'$ Home Care/ '[]'$ Refused Recommended Disposition /'[]'$ [place] [facility]/ '[]'$  Follow-up with PCP Additional Notes: Patient says her BP a little while ago was 198/92. She takes her lisinopril faithfully at bedtime. She's in [place] visiting her daughter. Advised with the BP and symptoms to go to the ED. She says no one is there at this time, they will be home after 5p. She asked if she could just take an extra lisinopril, advised not to take more than what is prescribed. She asks if Almyra Free could call and let her know what to do. I advised I will send this to her, but I do advise ED and not wait until tomorrow to go if the BP and symptoms remain. She verbalized understanding.    Reason for Disposition  [id number] Systolic BP  >= 0000000 OR Diastolic >= [id number] AND A999333 cardiac (e.g., breathing difficulty, chest pain) or neurologic symptoms (e.g., new-onset blurred or double vision, unsteady gait)  Answer Assessment - Initial Assessment Questions 1. BLOOD PRESSURE: "What is the blood pressure?" "Did you take at least two measurements 5 minutes apart?"     198/92 just now 2. ONSET: "When did you take your blood pressure?"     This morning and just now 3. HOW: "How did you take your blood pressure?" (e.g., automatic home BP monitor, visiting nurse)     Automatic home BP monitor 4. HISTORY: "Do you have a history of high blood pressure?"     Yes 5. MEDICINES: "Are you taking any medicines for blood pressure?" "Have you missed any doses recently?"     Lisinopril 20 mg, no missed doses 6. OTHER SYMPTOMS: "Do you have any symptoms?" (e.g., blurred vision, chest pain, difficulty breathing, headache, weakness)      Dizziness, little anxiety  Protocols used: Blood Pressure - High-A-AH

## 2023-01-04 ENCOUNTER — Ambulatory Visit: Payer: Self-pay | Admitting: *Deleted

## 2023-01-04 NOTE — Telephone Encounter (Signed)
  Chief Complaint: high BP Symptoms: anxiety- was seen at UC yesterday- advised PCP follow up for medication adjustment Frequency: Patient was seen yesterday at [facility] in [facility] Pertinent Negatives: Patient denies blurred vision, chest pain, difficulty breathing, headache, weakness Disposition: '[]'$ ED /'[]'$ Urgent Care (no appt availability in office) / '[]'$ Appointment(In office/virtual)/ '[]'$  [facility]/ '[]'$ Home Care/ '[x]'$ Refused Recommended Disposition /'[]'$ [place] [facility]/ '[]'$  Follow-up with PCP Additional Notes: See notes- Patient is out of state- in [place]. Patient was seen at [facility] for elevated BP and advised f/u with PCP for medication adjustment. Patient was given medication to take as needed for BP elevation- but has not picked it up yet. Patient is wanting PCP to adjust BP medication with this note- she states UC told her they can not change her medication- her PCP will have to do that. Patient does not have a return date and can not set up virtual with provider since she is out of state. Patient is frustrated about what to do- advised her to call UC and see if they will see her while she is there- but she is reluctant- she feels they will not help her and she wants PCP to address BP.

## 2023-01-04 NOTE — Telephone Encounter (Signed)
FYI

## 2023-01-04 NOTE — Telephone Encounter (Signed)
Reason for Disposition  Systolic BP  >= 99991111 OR Diastolic >= A999333  Answer Assessment - Initial Assessment Questions 1. BLOOD PRESSURE: "What is the blood pressure?" "Did you take at least two measurements 5 minutes apart?"     198/79 2. ONSET: "When did you take your blood pressure?"     8:45 3. HOW: "How did you take your blood pressure?" (e.g., automatic home BP monitor, visiting nurse)     Automatic- arm 4. HISTORY: "Do you have a history of high blood pressure?"     yes 5. MEDICINES: "Are you taking any medicines for blood pressure?" "Have you missed any doses recently?"     No  missed doses 6. OTHER SYMPTOMS: "Do you have any symptoms?" (e.g., blurred vision, chest pain, difficulty breathing, headache, weakness)     Anxiety  Patient is out of town- patient was given Rx (she does not know the name because she has not picked it up yet)- to take as needed-but told to follow up with her PCP for medication adjustment. Patient is in [place] and does not have a return date due to family illness. Patient advised to return to [facility] for BP medication adjustment while out of town. Patient declines because she was told UC could not adjust her medication and she needs to f/u with PCP- explained that since she is out of state- that is going to be difficult- can not do virtual with provider. Patient also notified her provider is not in office today.  Protocols used: Blood Pressure - High-A-AH

## 2023-01-07 NOTE — Telephone Encounter (Signed)
Patient notified

## 2023-01-07 NOTE — Telephone Encounter (Signed)
Was seen in ER. Was given clonidine 0.1 mg if BP go up  to take twice a day. Will may have to get another doctor there. Can you seen her a script for paxil only have 3 tablets there. [facility] [phone]

## 2023-01-17 DIAGNOSIS — H35352 Cystoid macular degeneration, left eye: Secondary | ICD-10-CM | POA: Diagnosis not present

## 2023-01-17 DIAGNOSIS — Z961 Presence of intraocular lens: Secondary | ICD-10-CM | POA: Diagnosis not present

## 2023-01-17 DIAGNOSIS — H30002 Unspecified focal chorioretinal inflammation, left eye: Secondary | ICD-10-CM | POA: Diagnosis not present

## 2023-01-17 DIAGNOSIS — H35372 Puckering of macula, left eye: Secondary | ICD-10-CM | POA: Diagnosis not present

## 2023-01-29 DIAGNOSIS — I10 Essential (primary) hypertension: Secondary | ICD-10-CM | POA: Diagnosis not present

## 2023-01-29 DIAGNOSIS — Z7689 Persons encountering health services in other specified circumstances: Secondary | ICD-10-CM | POA: Diagnosis not present

## 2023-01-30 DIAGNOSIS — H26493 Other secondary cataract, bilateral: Secondary | ICD-10-CM | POA: Diagnosis not present

## 2023-01-30 DIAGNOSIS — H35372 Puckering of macula, left eye: Secondary | ICD-10-CM | POA: Diagnosis not present

## 2023-01-30 DIAGNOSIS — Z961 Presence of intraocular lens: Secondary | ICD-10-CM | POA: Diagnosis not present

## 2023-02-06 ENCOUNTER — Telehealth: Payer: Self-pay | Admitting: Nurse Practitioner

## 2023-02-06 NOTE — Telephone Encounter (Signed)
Called patient to schedule Medicare Annual Wellness Visit (AWV). Left message for patient to call back and schedule Medicare Annual Wellness Visit (AWV).  Last date of AWV: 06/23/2019  Please schedule an appointment at any time with [facility] .  If any questions, please contact me at  [phone].  Thank you ,  [place] Direct Dial: [phone]

## 2023-02-13 DIAGNOSIS — H30002 Unspecified focal chorioretinal inflammation, left eye: Secondary | ICD-10-CM | POA: Diagnosis not present

## 2023-02-13 DIAGNOSIS — H35352 Cystoid macular degeneration, left eye: Secondary | ICD-10-CM | POA: Diagnosis not present

## 2023-02-13 DIAGNOSIS — Z961 Presence of intraocular lens: Secondary | ICD-10-CM | POA: Diagnosis not present

## 2023-02-13 DIAGNOSIS — H35372 Puckering of macula, left eye: Secondary | ICD-10-CM | POA: Diagnosis not present

## 2023-02-14 DIAGNOSIS — I10 Essential (primary) hypertension: Secondary | ICD-10-CM | POA: Diagnosis not present

## 2023-02-14 DIAGNOSIS — Z0000 Encounter for general adult medical examination without abnormal findings: Secondary | ICD-10-CM | POA: Diagnosis not present

## 2023-02-18 DIAGNOSIS — H2000 Unspecified acute and subacute iridocyclitis: Secondary | ICD-10-CM | POA: Diagnosis not present

## 2023-02-18 DIAGNOSIS — Z961 Presence of intraocular lens: Secondary | ICD-10-CM | POA: Diagnosis not present

## 2023-02-27 DIAGNOSIS — H2000 Unspecified acute and subacute iridocyclitis: Secondary | ICD-10-CM | POA: Diagnosis not present

## 2023-02-27 DIAGNOSIS — Z961 Presence of intraocular lens: Secondary | ICD-10-CM | POA: Diagnosis not present

## 2023-02-27 DIAGNOSIS — H35352 Cystoid macular degeneration, left eye: Secondary | ICD-10-CM | POA: Diagnosis not present

## 2023-02-28 DIAGNOSIS — R42 Dizziness and giddiness: Secondary | ICD-10-CM | POA: Diagnosis not present

## 2023-02-28 DIAGNOSIS — E876 Hypokalemia: Secondary | ICD-10-CM | POA: Diagnosis not present

## 2023-02-28 DIAGNOSIS — R55 Syncope and collapse: Secondary | ICD-10-CM | POA: Diagnosis not present

## 2023-03-01 DIAGNOSIS — I10 Essential (primary) hypertension: Secondary | ICD-10-CM | POA: Diagnosis not present

## 2023-03-01 DIAGNOSIS — R9431 Abnormal electrocardiogram [ECG] [EKG]: Secondary | ICD-10-CM | POA: Diagnosis not present

## 2023-03-01 DIAGNOSIS — R42 Dizziness and giddiness: Secondary | ICD-10-CM | POA: Diagnosis not present

## 2023-03-22 ENCOUNTER — Telehealth: Payer: Self-pay | Admitting: Nurse Practitioner

## 2023-03-22 NOTE — Telephone Encounter (Signed)
Contacted Joyce Pace to schedule their annual wellness visit. Appointment made for 04/05/2023.  [facility] Care Guide [facility] AWV TEAM Direct Dial: [phone]

## 2023-04-11 DIAGNOSIS — Z961 Presence of intraocular lens: Secondary | ICD-10-CM | POA: Diagnosis not present

## 2023-04-11 DIAGNOSIS — H2000 Unspecified acute and subacute iridocyclitis: Secondary | ICD-10-CM | POA: Diagnosis not present

## 2023-04-11 DIAGNOSIS — H30002 Unspecified focal chorioretinal inflammation, left eye: Secondary | ICD-10-CM | POA: Diagnosis not present

## 2023-04-11 DIAGNOSIS — H35352 Cystoid macular degeneration, left eye: Secondary | ICD-10-CM | POA: Diagnosis not present

## 2023-04-15 ENCOUNTER — Telehealth: Payer: Self-pay

## 2023-04-15 NOTE — Telephone Encounter (Signed)
Unsuccessful attempt to reach patient on preferred number listed in notes for scheduled AWV, Left message on voicemail okay to reschedule.  Kramer Hanrahan N. Dorrance Sellick, LPN. [facility] AWV Team Direct Dial: [phone]

## 2023-04-16 DIAGNOSIS — F32A Depression, unspecified: Secondary | ICD-10-CM | POA: Diagnosis not present

## 2023-04-16 DIAGNOSIS — I10 Essential (primary) hypertension: Secondary | ICD-10-CM | POA: Diagnosis not present

## 2023-05-10 DIAGNOSIS — H2000 Unspecified acute and subacute iridocyclitis: Secondary | ICD-10-CM | POA: Diagnosis not present

## 2023-05-10 DIAGNOSIS — H35372 Puckering of macula, left eye: Secondary | ICD-10-CM | POA: Diagnosis not present

## 2023-05-31 ENCOUNTER — Other Ambulatory Visit: Payer: Self-pay | Admitting: Nurse Practitioner

## 2023-05-31 DIAGNOSIS — I10 Essential (primary) hypertension: Secondary | ICD-10-CM

## 2023-05-31 NOTE — Telephone Encounter (Signed)
Requested Prescriptions  Pending Prescriptions Disp Refills   lisinopril (ZESTRIL) 20 MG tablet [Pharmacy Med Name: LISINOPRIL 20 MG TAB[*]] 30 tablet 0    Sig: TAKE ONE TABLET BY MOUTH ONE TIME DAILY     Cardiovascular:  ACE Inhibitors Failed - 05/31/2023 10:23 AM      Failed - Cr in normal range and within 180 days    Creat  Date Value Ref Range Status  07/13/2022 0.86 0.50 - 1.05 mg/dL Final         Failed - K in normal range and within 180 days    Potassium  Date Value Ref Range Status  07/13/2022 4.8 3.5 - 5.3 mmol/L Final         Failed - Valid encounter within last 6 months    Recent Outpatient Visits           6 months ago Persistent cough   [facility] Berniece Salines, FNP   7 months ago Viral upper respiratory tract infection   [facility] Berniece Salines, FNP   7 months ago Annual physical exam   [facility] Berniece Salines, FNP   10 months ago Essential hypertension   [facility] Berniece Salines, FNP   1 year ago Encounter for general adult medical examination with abnormal findings   [facility] [place], [place], [place]              Passed - Patient is not pregnant      Passed - Last BP in normal range    BP Readings from Last 1 Encounters:  11/08/22 120/84          Courtesy refill.

## 2023-07-16 DIAGNOSIS — H2000 Unspecified acute and subacute iridocyclitis: Secondary | ICD-10-CM | POA: Diagnosis not present

## 2023-07-16 DIAGNOSIS — H35372 Puckering of macula, left eye: Secondary | ICD-10-CM | POA: Diagnosis not present

## 2023-07-26 DIAGNOSIS — Z0000 Encounter for general adult medical examination without abnormal findings: Secondary | ICD-10-CM | POA: Diagnosis not present

## 2023-08-23 DIAGNOSIS — Z0001 Encounter for general adult medical examination with abnormal findings: Secondary | ICD-10-CM | POA: Diagnosis not present

## 2023-08-23 DIAGNOSIS — Z85038 Personal history of other malignant neoplasm of large intestine: Secondary | ICD-10-CM | POA: Diagnosis not present

## 2023-08-23 DIAGNOSIS — E7849 Other hyperlipidemia: Secondary | ICD-10-CM | POA: Diagnosis not present

## 2023-08-23 DIAGNOSIS — Z0000 Encounter for general adult medical examination without abnormal findings: Secondary | ICD-10-CM | POA: Diagnosis not present

## 2023-08-23 DIAGNOSIS — Z23 Encounter for immunization: Secondary | ICD-10-CM | POA: Diagnosis not present

## 2023-08-23 DIAGNOSIS — I10 Essential (primary) hypertension: Secondary | ICD-10-CM | POA: Diagnosis not present

## 2023-09-15 DIAGNOSIS — S6992XA Unspecified injury of left wrist, hand and finger(s), initial encounter: Secondary | ICD-10-CM | POA: Diagnosis not present

## 2023-09-15 DIAGNOSIS — M25532 Pain in left wrist: Secondary | ICD-10-CM | POA: Diagnosis not present

## 2023-11-08 DIAGNOSIS — Z1231 Encounter for screening mammogram for malignant neoplasm of breast: Secondary | ICD-10-CM | POA: Diagnosis not present

## 2023-11-12 DIAGNOSIS — H35372 Puckering of macula, left eye: Secondary | ICD-10-CM | POA: Diagnosis not present

## 2023-11-12 DIAGNOSIS — H35353 Cystoid macular degeneration, bilateral: Secondary | ICD-10-CM | POA: Diagnosis not present

## 2023-11-12 DIAGNOSIS — H2000 Unspecified acute and subacute iridocyclitis: Secondary | ICD-10-CM | POA: Diagnosis not present

## 2023-12-17 DIAGNOSIS — H35372 Puckering of macula, left eye: Secondary | ICD-10-CM | POA: Diagnosis not present

## 2023-12-17 DIAGNOSIS — H35353 Cystoid macular degeneration, bilateral: Secondary | ICD-10-CM | POA: Diagnosis not present

## 2024-02-11 DIAGNOSIS — E7849 Other hyperlipidemia: Secondary | ICD-10-CM | POA: Diagnosis not present

## 2024-02-11 DIAGNOSIS — I10 Essential (primary) hypertension: Secondary | ICD-10-CM | POA: Diagnosis not present

## 2024-02-17 DIAGNOSIS — I10 Essential (primary) hypertension: Secondary | ICD-10-CM | POA: Diagnosis not present

## 2024-02-17 DIAGNOSIS — Z85038 Personal history of other malignant neoplasm of large intestine: Secondary | ICD-10-CM | POA: Diagnosis not present

## 2024-02-17 DIAGNOSIS — E7849 Other hyperlipidemia: Secondary | ICD-10-CM | POA: Diagnosis not present

## 2024-02-20 IMAGING — MG MM DIGITAL SCREENING BILAT W/ TOMO AND CAD
8 series · 8 of 24 positions shown · non-contrast
Comparison: Previous exam(s).

CLINICAL DATA: Screening.

EXAM:
DIGITAL SCREENING BILATERAL MAMMOGRAM WITH TOMOSYNTHESIS AND CAD
TECHNIQUE: Bilateral screening digital craniocaudal and mediolateral oblique
mammograms were obtained. Bilateral screening digital breast
tomosynthesis was performed. The images were evaluated with
computer-aided detection.

[R CC synth-2D]
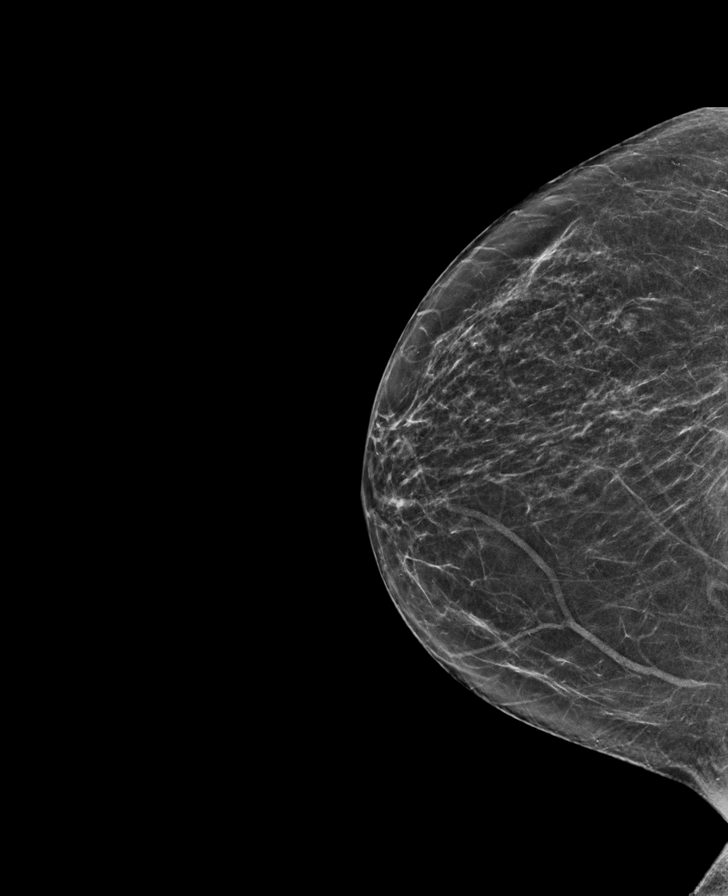

[L CC synth-2D]
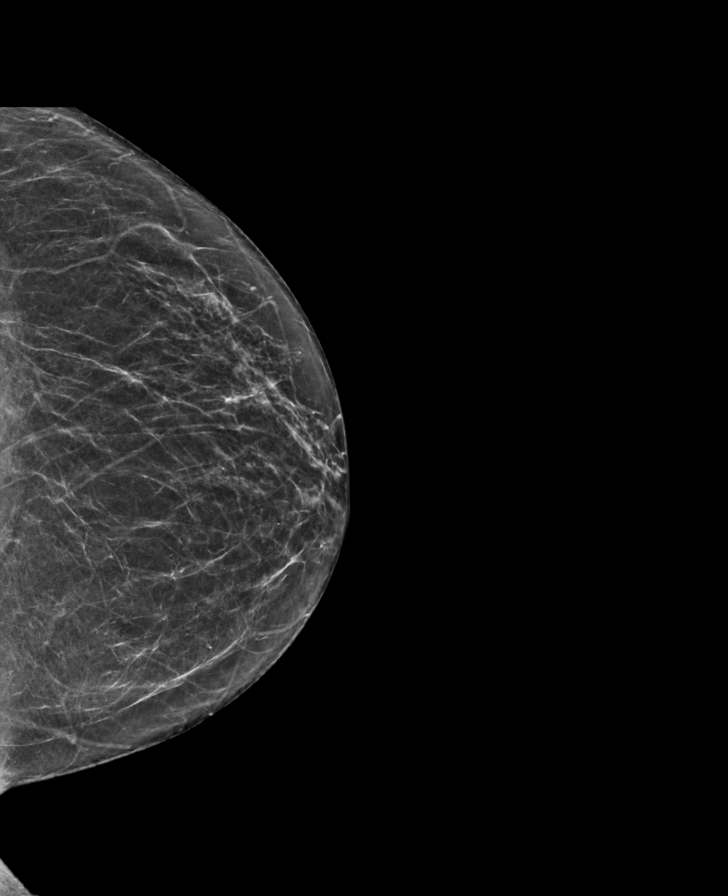

[L MLO synth-2D]
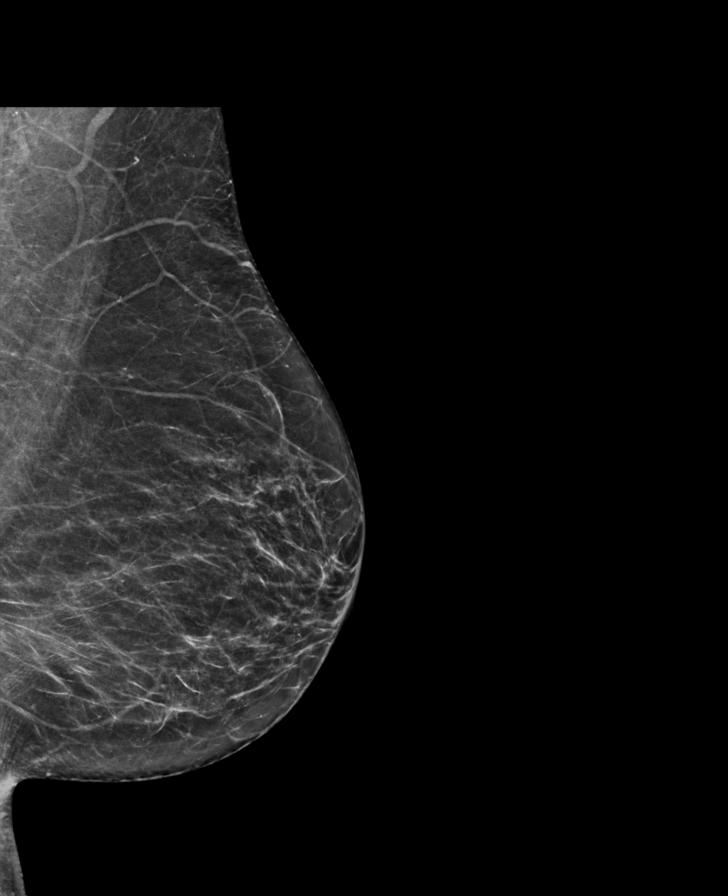

[R MLO synth-2D]
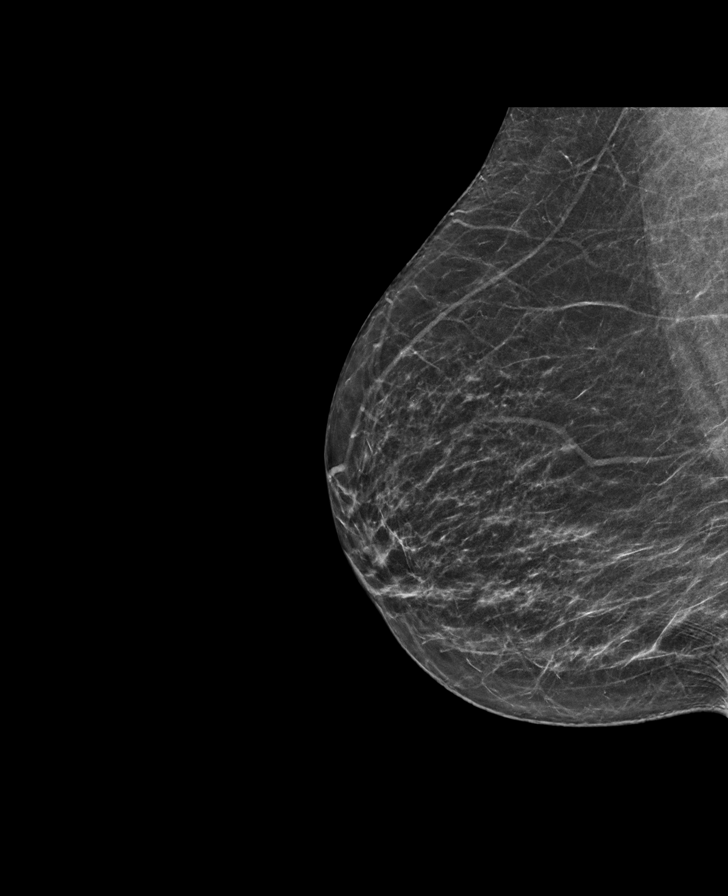

[L CC tomo · tomo slice 30/59.0]
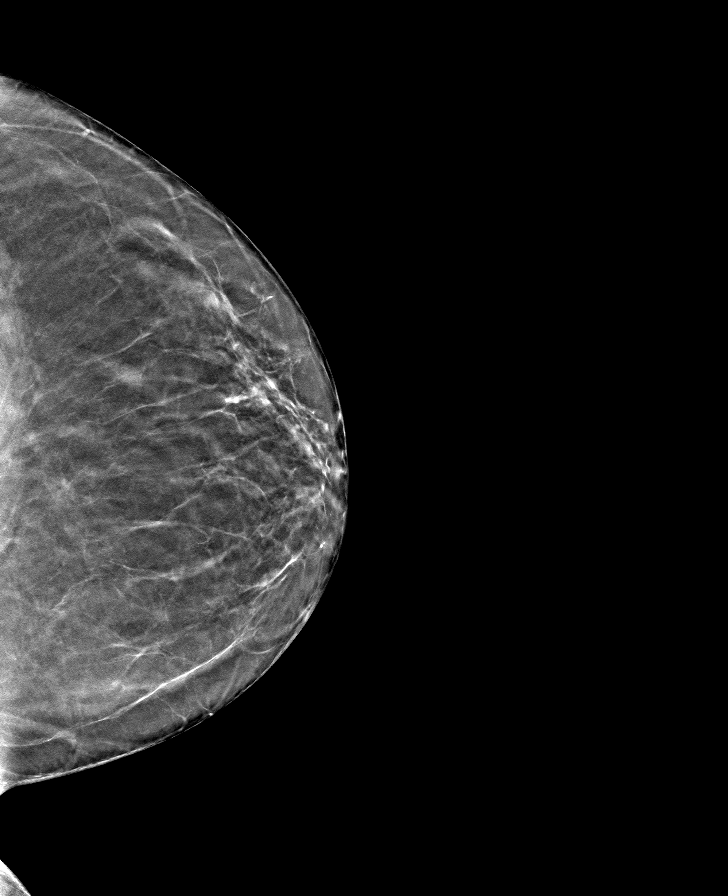

[R MLO tomo · tomo slice 31/60.0]
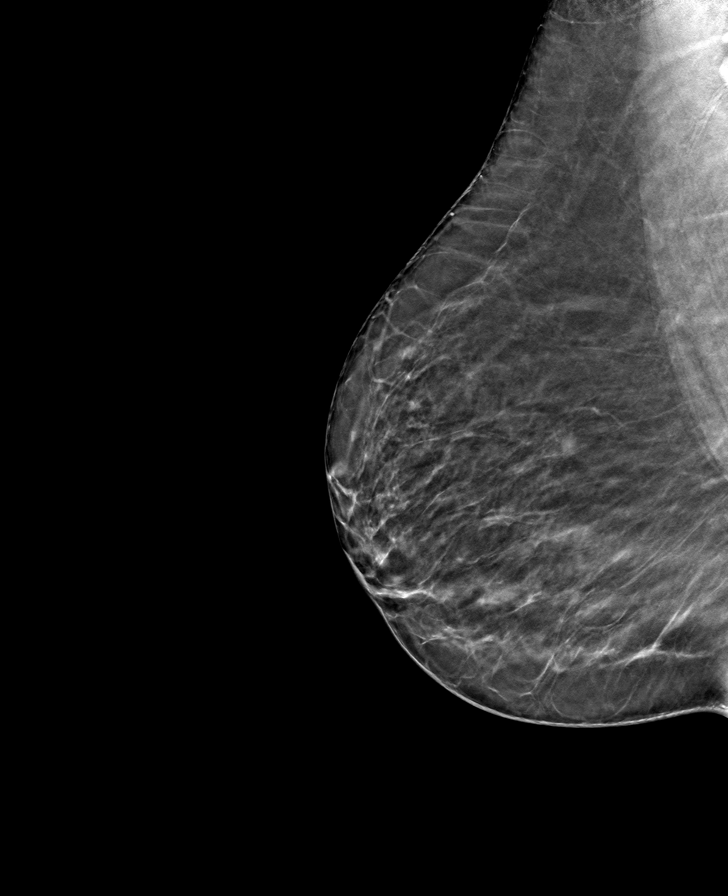

[L MLO tomo · tomo slice 31/62.0]
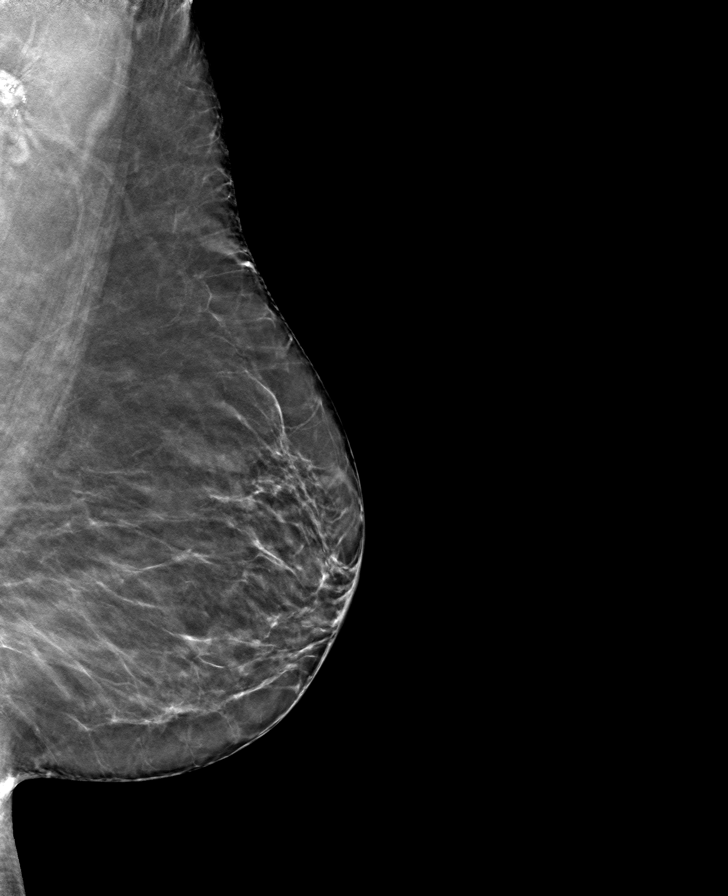

[R CC tomo · tomo slice 30/59.0]
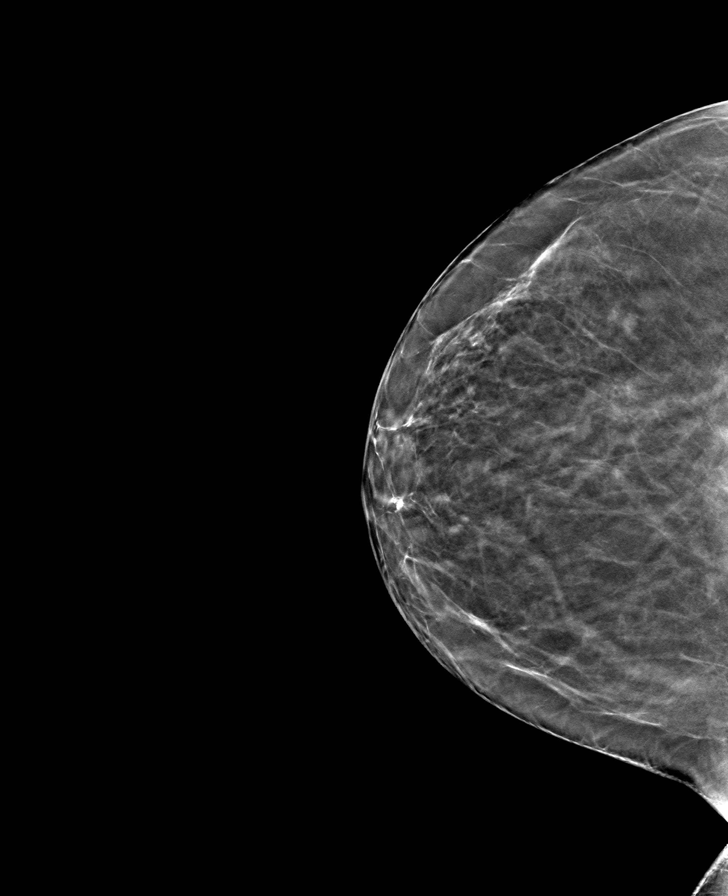

[8 of 24 positions shown; findings below may reference images not displayed]

ACR Breast Density Category b: There are scattered areas of
fibroglandular density.
FINDINGS: There are no findings suspicious for malignancy.
IMPRESSION: No mammographic evidence of malignancy. A result letter of this
screening mammogram will be mailed directly to the patient.

RECOMMENDATION:
Screening mammogram in one year. (Code:51-O-LD2)

BI-RADS CATEGORY  1: Negative.

## 2024-02-25 DIAGNOSIS — H35372 Puckering of macula, left eye: Secondary | ICD-10-CM | POA: Diagnosis not present

## 2024-02-25 DIAGNOSIS — H2000 Unspecified acute and subacute iridocyclitis: Secondary | ICD-10-CM | POA: Diagnosis not present

## 2024-02-25 DIAGNOSIS — H35353 Cystoid macular degeneration, bilateral: Secondary | ICD-10-CM | POA: Diagnosis not present

## 2024-03-23 ENCOUNTER — Telehealth: Admitting: Physician Assistant

## 2024-03-23 DIAGNOSIS — J208 Acute bronchitis due to other specified organisms: Secondary | ICD-10-CM | POA: Diagnosis not present

## 2024-03-23 DIAGNOSIS — B9689 Other specified bacterial agents as the cause of diseases classified elsewhere: Secondary | ICD-10-CM

## 2024-03-23 MED ORDER — AZITHROMYCIN 250 MG PO TABS: ORAL_TABLET | ORAL | 0 refills | Status: AC

## 2024-03-23 MED ORDER — PROMETHAZINE-DM 6.25-15 MG/5ML PO SYRP: 5.0000 mL | ORAL_SOLUTION | Freq: Three times a day (TID) | ORAL | 0 refills | Status: AC | PRN

## 2024-03-23 NOTE — Patient Instructions (Signed)
 Malvina Searle, thank you for joining Angelia Kelp, PA-C for today's virtual visit.  While this provider is not your primary care provider (PCP), if your PCP is located in our provider database this encounter information will be shared with them immediately following your visit.   A [facility] MyChart account gives you access to today's visit and all your visits, tests, and labs performed at [facility] " click here if you don't have a [facility] MyChart account or go to [email or web address]  Consent: (Patient) Joyce Pace provided verbal consent for this virtual visit at the beginning of the encounter.  Current Medications:  Current Outpatient Medications:    azithromycin  (ZITHROMAX ) 250 MG tablet, Take 2 tablets on day 1, then 1 tablet daily on days 2 through 5, Disp: 6 tablet, Rfl: 0   promethazine -dextromethorphan (PROMETHAZINE -DM) 6.25-15 MG/5ML syrup, Take 5 mLs by mouth 3 (three) times daily as needed., Disp: 118 mL, Rfl: 0   busPIRone  (BUSPAR ) 5 MG tablet, Take 1 tablet (5 mg total) by mouth 3 (three) times daily., Disp: 90 tablet, Rfl: 0   ILEVRO 0.3 % ophthalmic suspension, Place 1 drop into the left eye 2 (two) times daily., Disp: , Rfl:    lisinopril  (ZESTRIL ) 20 MG tablet, TAKE ONE TABLET BY MOUTH ONE TIME DAILY, Disp: 30 tablet, Rfl: 0   meclizine  (ANTIVERT ) 25 MG tablet, Take 1 tablet (25 mg total) by mouth 3 (three) times daily as needed for dizziness., Disp: 30 tablet, Rfl: 0   omeprazole  (PRILOSEC) 20 MG capsule, TAKE 1 CAPSULE(20 MG) BY MOUTH DAILY, Disp: 90 capsule, Rfl: 3   prednisoLONE acetate (PRED FORTE) 1 % ophthalmic suspension, SMARTSIG:In Eye(s), Disp: , Rfl:    predniSONE  (STERAPRED UNI-PAK 21 TAB) 10 MG (21) TBPK tablet, Take as directed on package.  (60 mg po on day 1, 50 mg po on day 2...), Disp: 21 tablet, Rfl: 0   sertraline  (ZOLOFT ) 25 MG tablet, Take 1 tablet (25 mg total) by mouth daily., Disp: 30 tablet, Rfl: 0    simvastatin  (ZOCOR ) 40 MG tablet, TAKE 1 TABLET(40 MG) BY MOUTH AT BEDTIME, Disp: 90 tablet, Rfl: 1   Medications ordered in this encounter:  Meds ordered this encounter  Medications   azithromycin  (ZITHROMAX ) 250 MG tablet    Sig: Take 2 tablets on day 1, then 1 tablet daily on days 2 through 5    Dispense:  6 tablet    Refill:  0    Supervising Provider:   LAMPTEY, PHILIP O [[id number]]   promethazine -dextromethorphan (PROMETHAZINE -DM) 6.25-15 MG/5ML syrup    Sig: Take 5 mLs by mouth 3 (three) times daily as needed.    Dispense:  118 mL    Refill:  0    Supervising Provider:   Corine Dice [[id number]]     *If you need refills on other medications prior to your next appointment, please contact your pharmacy*  Follow-Up: Call back or seek an in-person evaluation if the symptoms worsen or if the condition fails to improve as anticipated.  [facility] [phone]  Other Instructions Acute Bronchitis, Adult  Acute bronchitis is sudden inflammation of the main airways (bronchi) that come off the windpipe (trachea) in the lungs. The swelling causes the airways to get smaller and make more mucus than normal. This can make it hard to breathe and can cause coughing or noisy breathing (wheezing). Acute bronchitis may last several weeks. The cough may last longer. Allergies, asthma, and exposure to smoke may  make the condition worse. What are the causes? This condition can be caused by germs and by substances that irritate the lungs, including: Cold and flu viruses. The most common cause of this condition is the virus that causes the common cold. Bacteria. This is less common. Breathing in substances that irritate the lungs, including: Smoke from cigarettes and other forms of tobacco. Dust and pollen. Fumes from household cleaning products, gases, or burned fuel. Indoor or outdoor air pollution. What increases the risk? The following factors may make you more likely to  develop this condition: A weak body's defense system, also called the immune system. A condition that affects your lungs and breathing, such as asthma. What are the signs or symptoms? Common symptoms of this condition include: Coughing. This may bring up clear, yellow, or green mucus from your lungs (sputum). Wheezing. Runny or stuffy nose. Having too much mucus in your lungs (chest congestion). Shortness of breath. Aches and pains, including sore throat or chest. How is this diagnosed? This condition is usually diagnosed based on: Your symptoms and medical history. A physical exam. You may also have other tests, including tests to rule out other conditions, such as pneumonia. These tests include: A test of lung function. Test of a mucus sample to look for the presence of bacteria. Tests to check the oxygen level in your blood. Blood tests. Chest X-ray. How is this treated? Most cases of acute bronchitis clear up over time without treatment. Your health care provider may recommend: Drinking more fluids to help thin your mucus so it is easier to cough up. Taking inhaled medicine (inhaler) to improve air flow in and out of your lungs. Using a vaporizer or a humidifier. These are machines that add water to the air to help you breathe better. Taking a medicine that thins mucus and clears congestion (expectorant). Taking a medicine that prevents or stops coughing (cough suppressant). It is not common to take an antibiotic medicine for this condition. Follow these instructions at home:  Take over-the-counter and prescription medicines only as told by your health care provider. Use an inhaler, vaporizer, or humidifier as told by your health care provider. Take two teaspoons (10 mL) of honey at bedtime to lessen coughing at night. Drink enough fluid to keep your urine pale yellow. Do not use any products that contain nicotine or tobacco. These products include cigarettes, chewing tobacco,  and vaping devices, such as e-cigarettes. If you need help quitting, ask your health care provider. Get plenty of rest. Return to your normal activities as told by your health care provider. Ask your health care provider what activities are safe for you. Keep all follow-up visits. This is important. How is this prevented? To lower your risk of getting this condition again: Wash your hands often with soap and water for at least 20 seconds. If soap and water are not available, use hand sanitizer. Avoid contact with people who have cold symptoms. Try not to touch your mouth, nose, or eyes with your hands. Avoid breathing in smoke or chemical fumes. Breathing smoke or chemical fumes will make your condition worse. Get the flu shot every year. Contact a health care provider if: Your symptoms do not improve after 2 weeks. You have trouble coughing up the mucus. Your cough keeps you awake at night. You have a fever. Get help right away if you: Cough up blood. Feel pain in your chest. Have severe shortness of breath. Faint or keep feeling like you are going to  faint. Have a severe headache. Have a fever or chills that get worse. These symptoms may represent a serious problem that is an emergency. Do not wait to see if the symptoms will go away. Get medical help right away. Call your local emergency services (911 in the [place] Do not drive yourself to the hospital. Summary Acute bronchitis is inflammation of the main airways (bronchi) that come off the windpipe (trachea) in the lungs. The swelling causes the airways to get smaller and make more mucus than normal. Drinking more fluids can help thin your mucus so it is easier to cough up. Take over-the-counter and prescription medicines only as told by your health care provider. Do not use any products that contain nicotine or tobacco. These products include cigarettes, chewing tobacco, and vaping devices, such as e-cigarettes. If you need help  quitting, ask your health care provider. Contact a health care provider if your symptoms do not improve after 2 weeks. This information is not intended to replace advice given to you by your health care provider. Make sure you discuss any questions you have with your health care provider. Document Revised: 02/01/2022 Document Reviewed: 02/22/2021 Elsevier Patient Education  2024 [employer]vier [employer].   If you have been instructed to have an in-person evaluation today at a local Urgent Care facility, please use the link below. It will take you to a list of all of our available [facility], including address, phone number and hours of operation. Please do not delay care.  [facility]  If you or a family member do not have a primary care provider, use the link below to schedule a visit and establish care. When you choose a [facility] primary care physician or advanced practice provider, you gain a long-term partner in health. Find a Primary Care Provider  Learn more about [facility]'s in-office and virtual care options: [facility] Get Care Now

## 2024-03-23 NOTE — Progress Notes (Signed)
 Virtual Visit Consent   Moani Weipert, you are scheduled for a virtual visit with a [facility] provider today. Just as with appointments in the office, your consent must be obtained to participate. Your consent will be active for this visit and any virtual visit you may have with one of our providers in the next 365 days. If you have a MyChart account, a copy of this consent can be sent to you electronically.  As this is a virtual visit, video technology does not allow for your provider to perform a traditional examination. This may limit your provider's ability to fully assess your condition. If your provider identifies any concerns that need to be evaluated in person or the need to arrange testing (such as labs, EKG, etc.), we will make arrangements to do so. Although advances in technology are sophisticated, we cannot ensure that it will always work on either your end or our end. If the connection with a video visit is poor, the visit may have to be switched to a telephone visit. With either a video or telephone visit, we are not always able to ensure that we have a secure connection.  By engaging in this virtual visit, you consent to the provision of healthcare and authorize for your insurance to be billed (if applicable) for the services provided during this visit. Depending on your insurance coverage, you may receive a charge related to this service.  I need to obtain your verbal consent now. Are you willing to proceed with your visit today? Shenoa Hattabaugh has provided verbal consent on 03/23/2024 for a virtual visit (video or telephone). Angelia Kelp, PA-C  Date: 03/23/2024 12:14 PM   Virtual Visit via Video Note   I, Angelia Kelp, connected with  Joyce Pace  ([id number], 01-Sep-1951) on 03/23/24 at 12:15 PM EDT by a video-enabled telemedicine application and verified that I am speaking with the correct person using two identifiers.  Location: Patient:  Virtual Visit Location Patient: Home Provider: Virtual Visit Location Provider: Home Office   I discussed the limitations of evaluation and management by telemedicine and the availability of in person appointments. The patient expressed understanding and agreed to proceed.    History of Present Illness: Joyce Pace is a 73 y.o. who identifies as a female who was assigned female at birth, and is being seen today for cough and chest congestion.  HPI: URI  This is a new problem. The current episode started in the past 7 days. The problem has been gradually worsening. The maximum temperature recorded prior to her arrival was 100.4 - 100.9 F. The fever has been present for Less than 1 day. Associated symptoms include chest pain (tightness and heaviness), congestion, coughing and a sore throat (mild). Pertinent negatives include no diarrhea, ear pain, headaches, nausea, plugged ear sensation, rhinorrhea, sinus pain, vomiting or wheezing. Associated symptoms comments: Body aches. Treatments tried: dayquil, tylenol  severe cold and flu. The treatment provided no relief.     Problems:  Patient Active Problem List   Diagnosis Date Noted   Gastroesophageal reflux disease without esophagitis 07/13/2022   Recurrent major depressive disorder, in partial remission (HCC) 07/13/2022   Overweight with body mass index (BMI) of 25 to 25.9 in adult 10/16/2021   Barrett's esophagus without dysplasia    Essential hypertension 02/19/2018   Mixed hyperlipidemia 02/19/2018   History of colon cancer 02/19/2018   Anxiety 02/19/2018    Allergies:  Allergies  Allergen Reactions   Flax Seed [Flax Seed Oil] Other (  See Comments)    Throat gets itching and difficultly swallowing   Medications:  Current Outpatient Medications:    azithromycin  (ZITHROMAX ) 250 MG tablet, Take 2 tablets on day 1, then 1 tablet daily on days 2 through 5, Disp: 6 tablet, Rfl: 0   promethazine -dextromethorphan (PROMETHAZINE -DM)  6.25-15 MG/5ML syrup, Take 5 mLs by mouth 3 (three) times daily as needed., Disp: 118 mL, Rfl: 0   busPIRone  (BUSPAR ) 5 MG tablet, Take 1 tablet (5 mg total) by mouth 3 (three) times daily., Disp: 90 tablet, Rfl: 0   ILEVRO 0.3 % ophthalmic suspension, Place 1 drop into the left eye 2 (two) times daily., Disp: , Rfl:    lisinopril  (ZESTRIL ) 20 MG tablet, TAKE ONE TABLET BY MOUTH ONE TIME DAILY, Disp: 30 tablet, Rfl: 0   meclizine  (ANTIVERT ) 25 MG tablet, Take 1 tablet (25 mg total) by mouth 3 (three) times daily as needed for dizziness., Disp: 30 tablet, Rfl: 0   omeprazole  (PRILOSEC) 20 MG capsule, TAKE 1 CAPSULE(20 MG) BY MOUTH DAILY, Disp: 90 capsule, Rfl: 3   prednisoLONE acetate (PRED FORTE) 1 % ophthalmic suspension, SMARTSIG:In Eye(s), Disp: , Rfl:    predniSONE  (STERAPRED UNI-PAK 21 TAB) 10 MG (21) TBPK tablet, Take as directed on package.  (60 mg po on day 1, 50 mg po on day 2...), Disp: 21 tablet, Rfl: 0   sertraline  (ZOLOFT ) 25 MG tablet, Take 1 tablet (25 mg total) by mouth daily., Disp: 30 tablet, Rfl: 0   simvastatin  (ZOCOR ) 40 MG tablet, TAKE 1 TABLET(40 MG) BY MOUTH AT BEDTIME, Disp: 90 tablet, Rfl: 1  Observations/Objective: Patient is well-developed, well-nourished in no acute distress.  Resting comfortably at home.  Head is normocephalic, atraumatic.  No labored breathing.  Speech is clear and coherent with logical content.  Patient is alert and oriented at baseline.    Assessment and Plan: 1. Acute bacterial bronchitis (Primary) - azithromycin  (ZITHROMAX ) 250 MG tablet; Take 2 tablets on day 1, then 1 tablet daily on days 2 through 5  Dispense: 6 tablet; Refill: 0 - promethazine -dextromethorphan (PROMETHAZINE -DM) 6.25-15 MG/5ML syrup; Take 5 mLs by mouth 3 (three) times daily as needed.  Dispense: 118 mL; Refill: 0  - Worsening despite OTC medications - Will treat with Z-pack and Promethazine  DM - Can continue Mucinex (PLAIN) if needed for thinning mucus - Push fluids.   - Rest.  - Steam and humidifier can help - Seek in person evaluation if worsening or symptoms fail to improve    Follow Up Instructions: I discussed the assessment and treatment plan with the patient. The patient was provided an opportunity to ask questions and all were answered. The patient agreed with the plan and demonstrated an understanding of the instructions.  A copy of instructions were sent to the patient via MyChart unless otherwise noted below.    The patient was advised to call back or seek an in-person evaluation if the symptoms worsen or if the condition fails to improve as anticipated.    Angelia Kelp, PA-C

## 2024-03-29 DIAGNOSIS — J209 Acute bronchitis, unspecified: Secondary | ICD-10-CM | POA: Diagnosis not present

## 2024-03-29 DIAGNOSIS — R062 Wheezing: Secondary | ICD-10-CM | POA: Diagnosis not present

## 2024-03-29 DIAGNOSIS — R051 Acute cough: Secondary | ICD-10-CM | POA: Diagnosis not present

## 2024-04-16 DIAGNOSIS — N3090 Cystitis, unspecified without hematuria: Secondary | ICD-10-CM | POA: Diagnosis not present

## 2024-04-20 DIAGNOSIS — M5442 Lumbago with sciatica, left side: Secondary | ICD-10-CM | POA: Diagnosis not present

## 2024-04-20 DIAGNOSIS — K219 Gastro-esophageal reflux disease without esophagitis: Secondary | ICD-10-CM | POA: Diagnosis not present

## 2024-04-20 DIAGNOSIS — R5383 Other fatigue: Secondary | ICD-10-CM | POA: Diagnosis not present

## 2024-07-21 DIAGNOSIS — H35353 Cystoid macular degeneration, bilateral: Secondary | ICD-10-CM | POA: Diagnosis not present

## 2024-07-21 DIAGNOSIS — H8112 Benign paroxysmal vertigo, left ear: Secondary | ICD-10-CM | POA: Diagnosis not present

## 2024-07-21 DIAGNOSIS — H2000 Unspecified acute and subacute iridocyclitis: Secondary | ICD-10-CM | POA: Diagnosis not present

## 2024-07-21 DIAGNOSIS — H35372 Puckering of macula, left eye: Secondary | ICD-10-CM | POA: Diagnosis not present

## 2024-08-27 DIAGNOSIS — Z0001 Encounter for general adult medical examination with abnormal findings: Secondary | ICD-10-CM | POA: Diagnosis not present

## 2024-08-27 DIAGNOSIS — R300 Dysuria: Secondary | ICD-10-CM | POA: Diagnosis not present

## 2024-08-27 DIAGNOSIS — Z0000 Encounter for general adult medical examination without abnormal findings: Secondary | ICD-10-CM | POA: Diagnosis not present

## 2024-08-27 DIAGNOSIS — N3001 Acute cystitis with hematuria: Secondary | ICD-10-CM | POA: Diagnosis not present

## 2024-08-27 DIAGNOSIS — E7849 Other hyperlipidemia: Secondary | ICD-10-CM | POA: Diagnosis not present
